# Patient Record
Sex: Female | Born: 1965 | Race: White | Hispanic: No | Marital: Married | Smoking: Never smoker
Health system: Southern US, Community
[De-identification: ages and names within clinical notes are randomized; demographics above are authoritative.]

## PROBLEM LIST (undated history)

## (undated) DIAGNOSIS — F419 Anxiety disorder, unspecified: Secondary | ICD-10-CM

## (undated) DIAGNOSIS — I82439 Acute embolism and thrombosis of unspecified popliteal vein: Secondary | ICD-10-CM

## (undated) DIAGNOSIS — I10 Essential (primary) hypertension: Secondary | ICD-10-CM

## (undated) DIAGNOSIS — T7840XA Allergy, unspecified, initial encounter: Secondary | ICD-10-CM

## (undated) HISTORY — DX: Acute embolism and thrombosis of unspecified popliteal vein: I82.439

## (undated) HISTORY — DX: Essential (primary) hypertension: I10

## (undated) HISTORY — DX: Allergy, unspecified, initial encounter: T78.40XA

## (undated) HISTORY — DX: Anxiety disorder, unspecified: F41.9

---

## 2004-05-02 ENCOUNTER — Ambulatory Visit: Payer: Self-pay | Admitting: Unknown Physician Specialty

## 2004-05-17 ENCOUNTER — Inpatient Hospital Stay: Payer: Self-pay

## 2004-07-17 ENCOUNTER — Inpatient Hospital Stay: Payer: Self-pay

## 2005-10-18 ENCOUNTER — Ambulatory Visit: Payer: Self-pay | Admitting: Unknown Physician Specialty

## 2007-05-15 ENCOUNTER — Emergency Department: Payer: Self-pay | Admitting: Emergency Medicine

## 2007-06-09 ENCOUNTER — Ambulatory Visit: Payer: Self-pay | Admitting: Obstetrics & Gynecology

## 2007-06-12 ENCOUNTER — Ambulatory Visit: Payer: Self-pay | Admitting: Obstetrics & Gynecology

## 2008-12-22 IMAGING — CT CT ABD-PELV W/ CM
1 of 2 series · 16 of 32 positions shown, 20 images · non-contrast
Comparison: none

REASON FOR EXAM: Left lower quadrant pain, fever, elevated WBC, history of
right ovarian
COMMENTS:

[Series 2: abd/pelvis · axial · 0.76mm/px · z∈[+385,+799]mm · 16 of 152 slices shown, 20 images]
[im 7/152  soft-tissue]
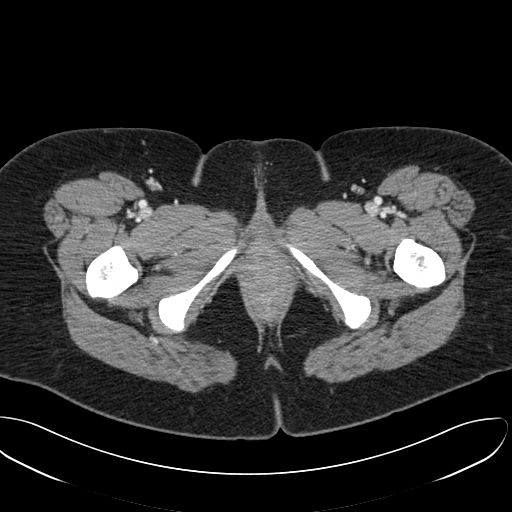
[im 7/152  bone]
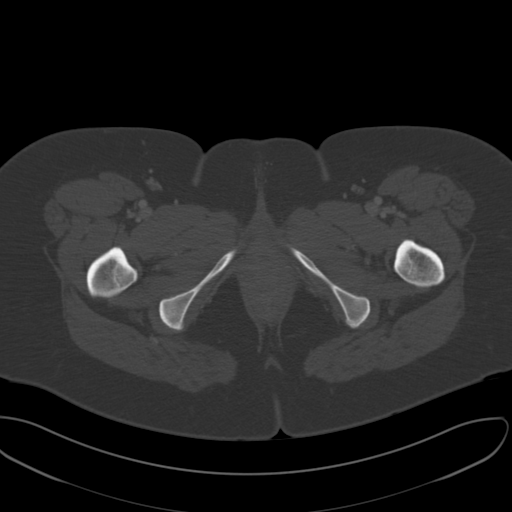
[im 19/152  soft-tissue]
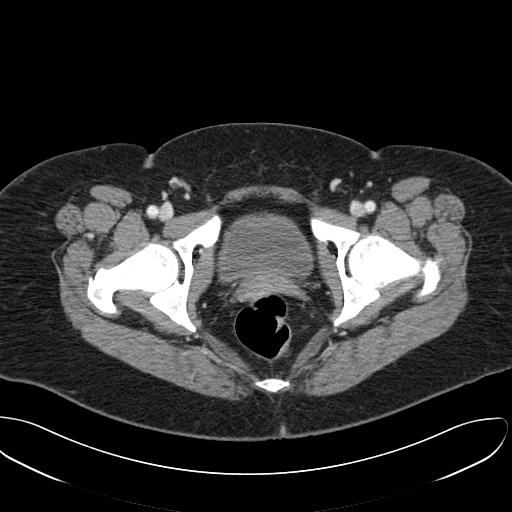
[im 32/152  soft-tissue]
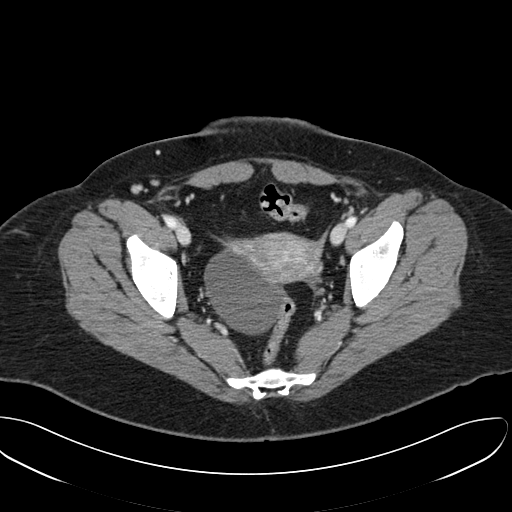
[im 38/152  soft-tissue]
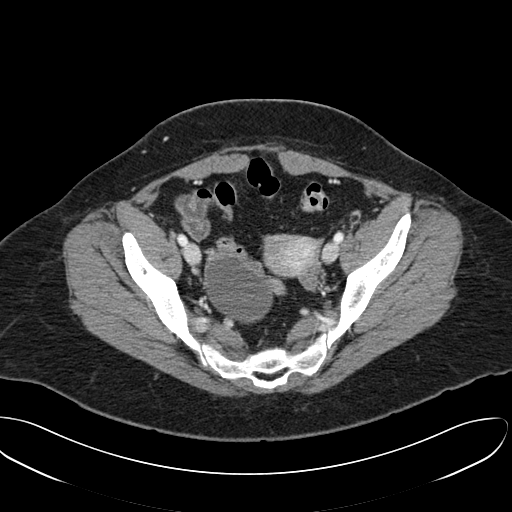
[im 51/152  soft-tissue]
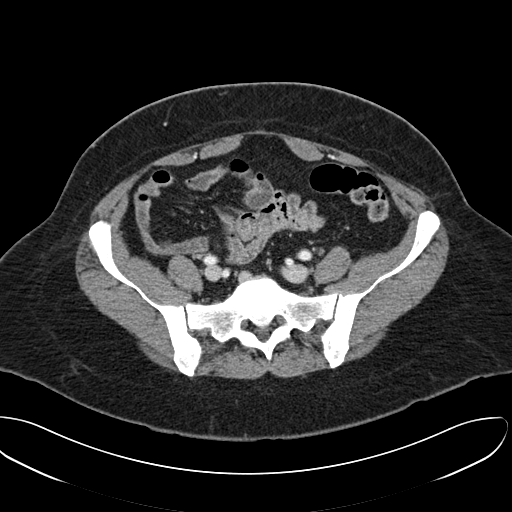
[im 63/152  soft-tissue]
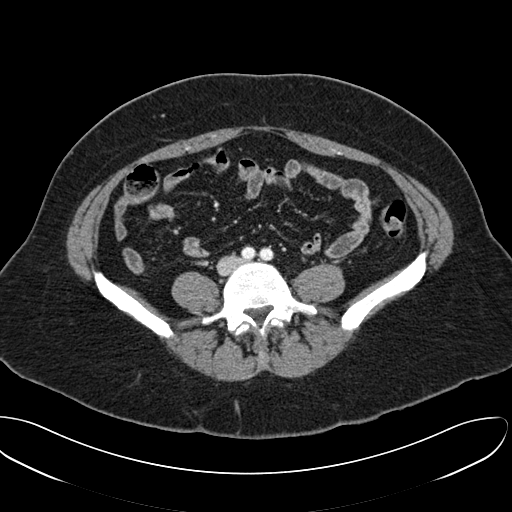
[im 70/152  soft-tissue]
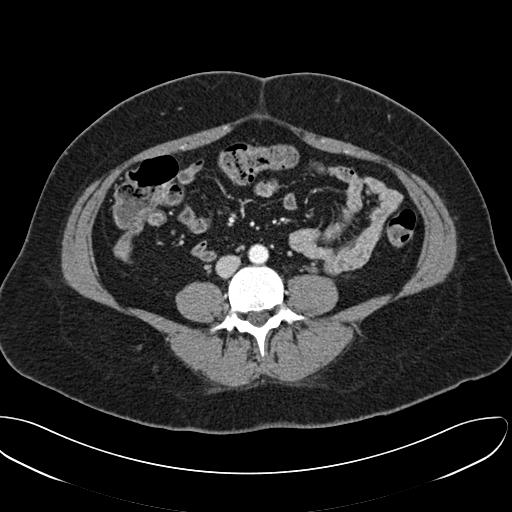
[im 82/152  soft-tissue]
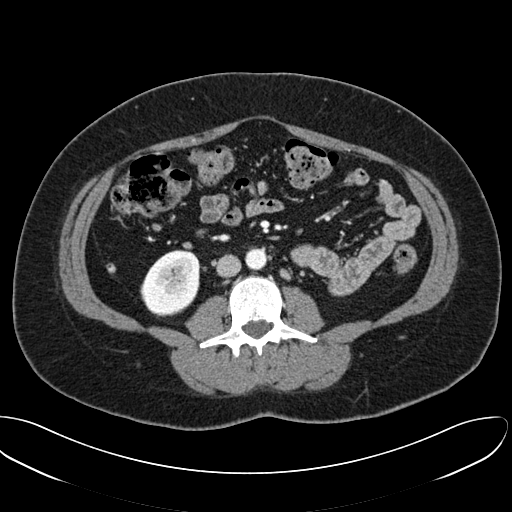
[im 89/152  soft-tissue]
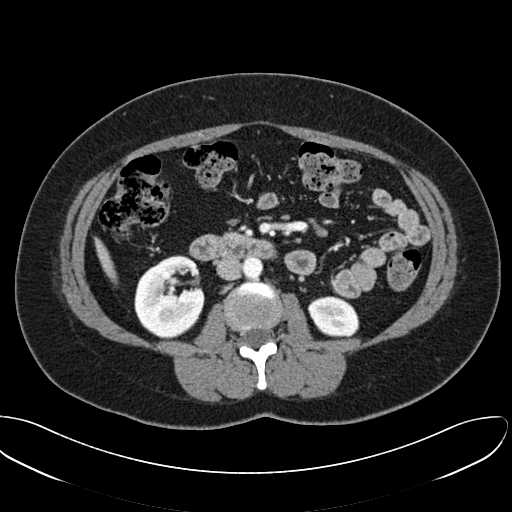
[im 89/152  bone]
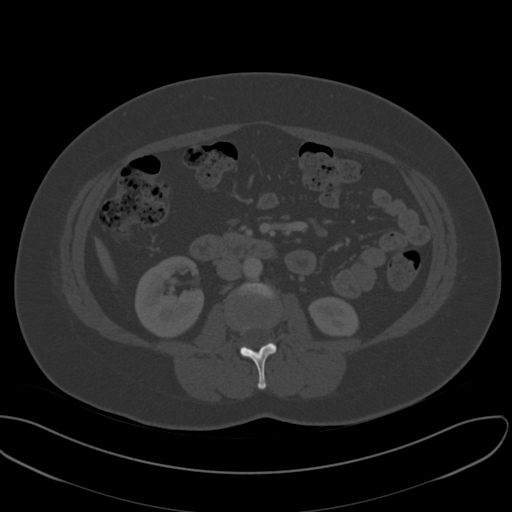
[im 101/152  soft-tissue]
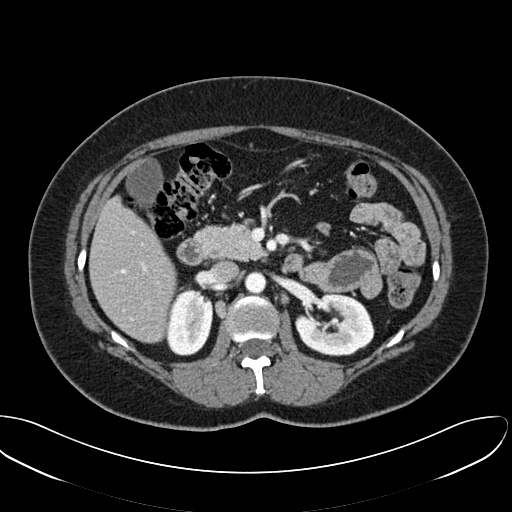
[im 114/152  soft-tissue]
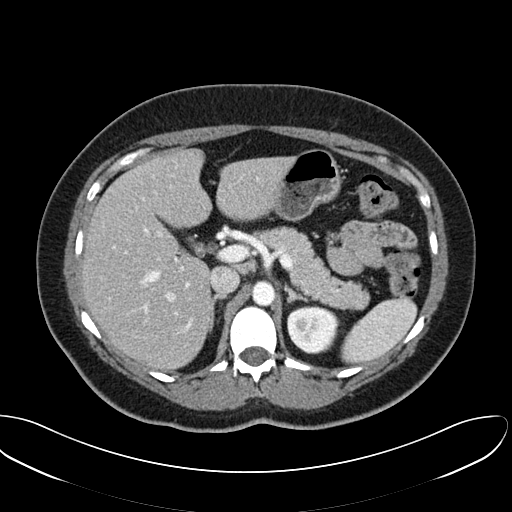
[im 120/152  soft-tissue]
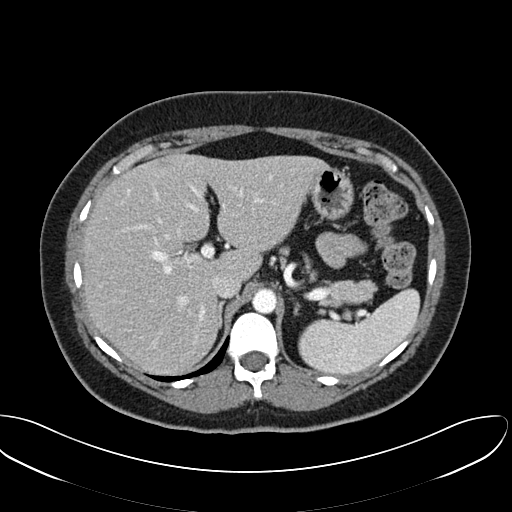
[im 126/152  lung]
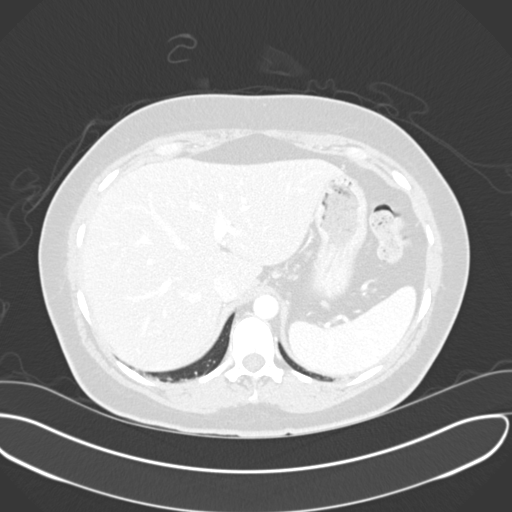
[im 133/152  soft-tissue]
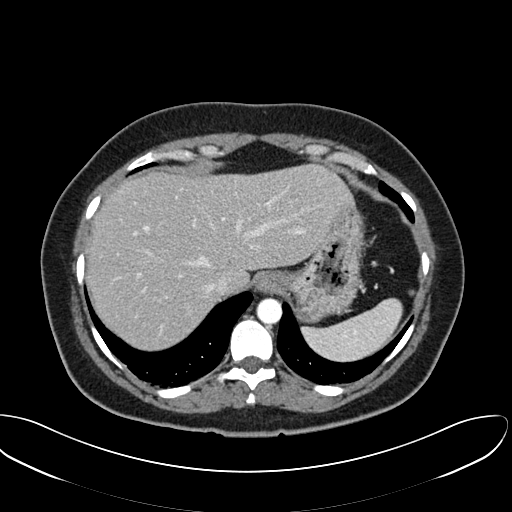
[im 133/152  lung]
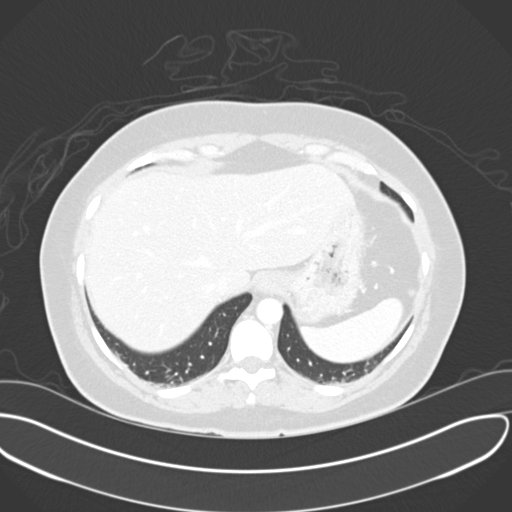
[im 139/152  lung]
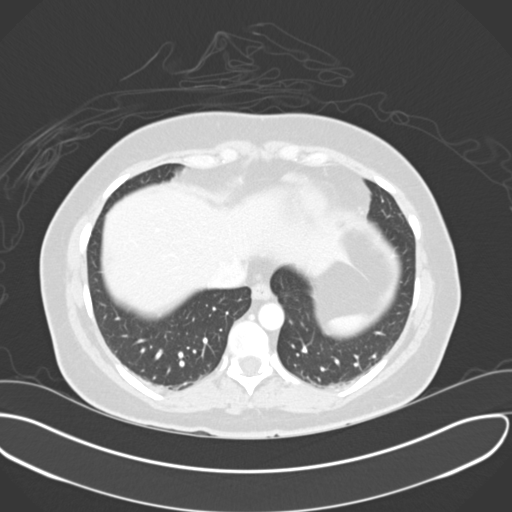
[im 145/152  soft-tissue]
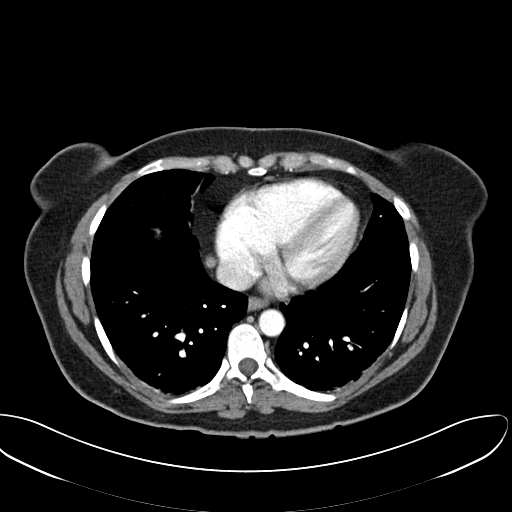
[im 145/152  lung]
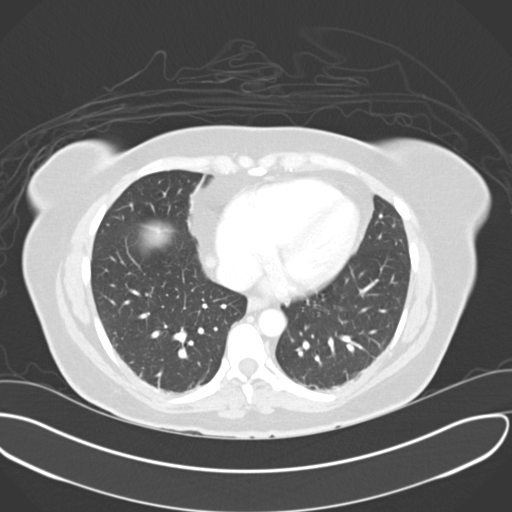

[16 of 32 positions shown; findings below may reference images not displayed]

PROCEDURE:     CT  - CT ABDOMEN / PELVIS  W  - May 15, 2007 [DATE]

RESULT:     IV contrast enhanced CT of abdomen and pelvis is obtained.

The liver and spleen are normal. The pancreas is normal. The adrenals are
normal. No focal renal abnormalities are identified. The lung bases are
clear. No free air is noted. The kidneys are unremarkable. The appendix is
unremarkable. The abdominal aorta is unremarkable. Shotty, retroperitoneal
lymph nodes are noted. These are nonspecific. An approximately 6.7 cm cyst
is noted in the RIGHT adnexal region. Ultrasound of the pelvis is suggested
for further evaluation. Small inguinal lymph nodes are noted. These appear
to be fatty replaced.
IMPRESSION: Large, RIGHT adnexal cyst for which ultrasound is suggested
for further evaluation. Pregnancy test may also prove useful.

## 2009-04-12 ENCOUNTER — Ambulatory Visit: Payer: Self-pay | Admitting: Family Medicine

## 2012-02-11 ENCOUNTER — Other Ambulatory Visit: Payer: Self-pay | Admitting: Obstetrics & Gynecology

## 2012-02-11 DIAGNOSIS — Z1239 Encounter for other screening for malignant neoplasm of breast: Secondary | ICD-10-CM

## 2012-02-11 DIAGNOSIS — Z803 Family history of malignant neoplasm of breast: Secondary | ICD-10-CM

## 2012-02-24 ENCOUNTER — Other Ambulatory Visit: Payer: Self-pay

## 2012-12-30 ENCOUNTER — Ambulatory Visit: Payer: Self-pay | Admitting: Obstetrics & Gynecology

## 2013-08-03 ENCOUNTER — Ambulatory Visit (INDEPENDENT_AMBULATORY_CARE_PROVIDER_SITE_OTHER): Payer: 59 | Admitting: Family Medicine

## 2013-08-03 ENCOUNTER — Encounter: Payer: Self-pay | Admitting: Family Medicine

## 2013-08-03 VITALS — BP 142/98 | HR 72 | Temp 98.2°F | Resp 16

## 2013-08-03 DIAGNOSIS — N951 Menopausal and female climacteric states: Secondary | ICD-10-CM

## 2013-08-03 DIAGNOSIS — F39 Unspecified mood [affective] disorder: Secondary | ICD-10-CM

## 2013-08-03 DIAGNOSIS — I10 Essential (primary) hypertension: Secondary | ICD-10-CM

## 2013-08-03 DIAGNOSIS — J309 Allergic rhinitis, unspecified: Secondary | ICD-10-CM

## 2013-08-03 DIAGNOSIS — R4586 Emotional lability: Secondary | ICD-10-CM

## 2013-08-03 MED ORDER — SERTRALINE HCL 50 MG PO TABS: 50.0000 mg | ORAL_TABLET | Freq: Every day | ORAL | Status: DC

## 2013-08-03 MED ORDER — MONTELUKAST SODIUM 10 MG PO TABS: 10.0000 mg | ORAL_TABLET | Freq: Every day | ORAL | Status: DC

## 2013-08-03 MED ORDER — AMOXICILLIN-POT CLAVULANATE 875-125 MG PO TABS: 1.0000 | ORAL_TABLET | Freq: Two times a day (BID) | ORAL | Status: DC

## 2013-08-03 NOTE — Progress Notes (Signed)
Subjective:    Patient ID: Amber Nelson, female    DOB: 12/24/1965, 48 y.o.   MRN: [id number]  HPI Chief Complaint  Patient presents with  . Hypertension    on metoprolol  . Dysmenorrhea    cramping/ wants hormones checked / also emotional   This chart was scribed for Ethelda ChickKristi M Smith, MD by Andrew Auaven Small, ED Scribe. This patient was seen in room 22 and the patient's care was started at 12:50 PM.  HPI Comments: Amber Nelson is a 48 y.o. female who presents to the [facility] to establish care and complaining of possible hormonal issues. Pt reports that she has been under a lot of stress due work. Pt denies home being stressful. Pt is miserable at work and is planning to apply to other places.  Pt has trouble controlling emotions. Pt becomes irritable easily. Pt takes xanax two days a week but as of lately has been trying to deal with her stress. Pt does not need refill for xanax. Pt states she is sleeping okay. She reports she wakes up during the night but has no trouble falling back to sleep. Pt has talked to Dr. Tiburcio PeaHarris about a hysterectomy. Pt had an ablation and reports irregular period consisting of spotting. She reports heavy cramping and has to take 3 ibuprofen 3 times a day to be able to sleep. Pt reports her BP is good. Pt reports her BP has been running 122/76. Pt has been exercising. Pt takes flonase, zyrtec and uses eye drops for her allergies. She reports nasal discharge clear but is coughing up yellow sputum.  History reviewed. No pertinent past medical history. Allergies  Allergen Reactions  . Sulfa Antibiotics    Prior to Admission medications   Not on File   Review of Systems  Constitutional: Negative for fever, chills, diaphoresis and fatigue.  HENT: Positive for rhinorrhea.   Eyes: Negative for visual disturbance.  Respiratory: Positive for cough. Negative for shortness of breath.   Cardiovascular: Negative for chest pain, palpitations and leg  swelling.  Gastrointestinal: Negative for nausea, vomiting, abdominal pain, diarrhea and constipation.  Endocrine: Negative for cold intolerance, heat intolerance, polydipsia, polyphagia and polyuria.  Genitourinary: Positive for menstrual problem.  Allergic/Immunologic: Positive for environmental allergies.  Neurological: Negative for dizziness, tremors, seizures, syncope, facial asymmetry, speech difficulty, weakness, light-headedness, numbness and headaches.  Psychiatric/Behavioral: Positive for dysphoric mood. Negative for suicidal ideas, sleep disturbance and self-injury. The patient is nervous/anxious.       Objective:   Physical Exam  Constitutional: She is oriented to person, place, and time. She appears well-developed and well-nourished. No distress.  HENT:  Head: Normocephalic and atraumatic.  Right Ear: Hearing, tympanic membrane, external ear and ear canal normal.  Left Ear: Hearing, tympanic membrane, external ear and ear canal normal.  Nose: Nose normal.  Mouth/Throat: Oropharynx is clear and moist.  Eyes: Conjunctivae and EOM are normal. Pupils are equal, round, and reactive to light.  Neck: Normal range of motion. Neck supple. Carotid bruit is not present. No thyromegaly present.  Cardiovascular: Normal rate, regular rhythm, normal heart sounds and intact distal pulses.  Exam reveals no gallop and no friction rub.   No murmur heard. Pulmonary/Chest: Effort normal and breath sounds normal. No respiratory distress. She has no wheezes. She has no rales. She exhibits no tenderness.  Abdominal: Soft. Bowel sounds are normal. She exhibits no distension and no mass. There is no tenderness. There is no rebound and no  guarding.  Musculoskeletal: Normal range of motion. She exhibits no edema.  Lymphadenopathy:    She has no cervical adenopathy.  Neurological: She is alert and oriented to person, place, and time. No cranial nerve deficit.  Skin: Skin is warm and dry. No rash noted.  She is not diaphoretic. No erythema. No pallor.  Psychiatric: Her behavior is normal. Her mood appears anxious.  Tearful  Nursing note and vitals reviewed.     Assessment & Plan:   1. Mood swings   2. Perimenopause   3. Allergic rhinitis, cause unspecified   4. Essential hypertension, benign     1. Mood swings/stress: New. Secondary to work stressors and less likely due to perimenopausal state.  Rx for Zoloft 50mg  daily provided; continue Xanax PRN. Limit caffeine intake; recommend daily regular exercise for stress management; recommend psychotherapy.  Good family support. Obtain labs to rule out secondary causes of symptoms. 2.  Perimenopausal state: New.  Followed closely by gynecology.  Obtain labs.   3.  Allergic Rhinitis: uncontrolled; rx for Singulair provided. If no improvement in allergy symptoms in one week, rx for Augmentin provided. 4. HTN: controlled; obtain labs; continue Metoprolol.   Meds ordered this encounter  Medications  . metoprolol succinate (TOPROL-XL) 25 MG 24 hr tablet    Sig: Take 25 mg by mouth daily.  Marland Kitchen. ALPRAZolam (XANAX) 0.5 MG tablet    Sig: Take 0.25-0.5 mg by mouth at bedtime as needed for anxiety.  Marland Kitchen. azelastine (OPTIVAR) 0.05 % ophthalmic solution    Sig: Place 1 drop into both eyes daily.  . fluticasone (FLONASE) 50 MCG/ACT nasal spray    Sig: Place 2 sprays into both nostrils daily.  Marland Kitchen. DISCONTD: montelukast (SINGULAIR) 10 MG tablet    Sig: Take 10 mg by mouth at bedtime.  Marland Kitchen. DISCONTD: sertraline (ZOLOFT) 50 MG tablet    Sig: Take 1 tablet (50 mg total) by mouth daily.    Dispense:  30 tablet    Refill:  5  . sertraline (ZOLOFT) 50 MG tablet    Sig: Take 1 tablet (50 mg total) by mouth daily.    Dispense:  90 tablet    Refill:  3  . montelukast (SINGULAIR) 10 MG tablet    Sig: Take 1 tablet (10 mg total) by mouth at bedtime.    Dispense:  90 tablet    Refill:  3  . amoxicillin-clavulanate (AUGMENTIN) 875-125 MG per tablet    Sig: Take 1  tablet by mouth 2 (two) times daily.    Dispense:  20 tablet    Refill:  0     I personally performed the services described in this documentation, which was scribed in my presence.  The recorded information has been reviewed and is accurate.  Nilda SimmerKristi Smith, M.D.  [facility]  [place] [address] [place], [place]  [id number] [phone] phone [phone] fax

## 2013-09-17 ENCOUNTER — Encounter: Payer: Self-pay | Admitting: Family Medicine

## 2014-05-06 ENCOUNTER — Ambulatory Visit: Payer: Self-pay | Admitting: Obstetrics & Gynecology

## 2014-05-06 DIAGNOSIS — I10 Essential (primary) hypertension: Secondary | ICD-10-CM

## 2014-05-13 ENCOUNTER — Ambulatory Visit: Payer: Self-pay | Admitting: Obstetrics & Gynecology

## 2014-05-13 HISTORY — PX: ABDOMINAL HYSTERECTOMY: SHX81

## 2014-07-12 ENCOUNTER — Encounter: Payer: Self-pay | Admitting: Family Medicine

## 2014-07-12 ENCOUNTER — Ambulatory Visit (INDEPENDENT_AMBULATORY_CARE_PROVIDER_SITE_OTHER): Payer: 59 | Admitting: Family Medicine

## 2014-07-12 VITALS — BP 164/96 | HR 69 | Temp 98.4°F | Resp 16 | Ht 63.25 in | Wt 189.4 lb

## 2014-07-12 DIAGNOSIS — H1013 Acute atopic conjunctivitis, bilateral: Secondary | ICD-10-CM

## 2014-07-12 DIAGNOSIS — E669 Obesity, unspecified: Secondary | ICD-10-CM

## 2014-07-12 DIAGNOSIS — F4323 Adjustment disorder with mixed anxiety and depressed mood: Secondary | ICD-10-CM | POA: Diagnosis not present

## 2014-07-12 DIAGNOSIS — I10 Essential (primary) hypertension: Secondary | ICD-10-CM | POA: Diagnosis not present

## 2014-07-12 DIAGNOSIS — J301 Allergic rhinitis due to pollen: Secondary | ICD-10-CM

## 2014-07-12 LAB — POCT URINALYSIS DIPSTICK
Bilirubin, UA: NEGATIVE
Blood, UA: NEGATIVE
GLUCOSE UA: NEGATIVE
Ketones, UA: NEGATIVE
Leukocytes, UA: NEGATIVE
Nitrite, UA: NEGATIVE
PROTEIN UA: NEGATIVE
SPEC GRAV UA: 1.01
Urobilinogen, UA: 0.2
pH, UA: 6

## 2014-07-12 LAB — SURGICAL PATHOLOGY

## 2014-07-12 MED ORDER — METOPROLOL SUCCINATE ER 50 MG PO TB24: 50.0000 mg | ORAL_TABLET | Freq: Every day | ORAL | Status: DC

## 2014-07-12 MED ORDER — AZELASTINE HCL 0.05 % OP SOLN: 1.0000 [drp] | Freq: Two times a day (BID) | OPHTHALMIC | Status: DC

## 2014-07-12 NOTE — Progress Notes (Signed)
Subjective:    Patient ID: Amber Nelson, female    DOB: 07/28/1965, 49 y.o.   MRN: [id number]  07/12/2014  MEDICATION REVIEW; Hypertension; and Anxiety   HPI This 49 y.o. female presents for eleven month follow-up:  1.  Dysmenorrhea: s/p hysterectomy; [facility].  Two weeks out of work. Doing well.    2.  Anxiety and depression: management changes made at last visit included adding Zoloft 50mg  daily.  Doing much better emotionally. new job 01/2014; decreased stress; driving really stressed out.  Does not like traffic.  Different atmosphere.  Now back in [place].  Noticed an improvement; anxiety has been better.  Knew Zoloft would work.   Son plays travel baseball.  No Xanax since 01/2014 with job change. Ready to wean off of Zoloft.      3.  HTN: taking Metorpolol XL 25mg  daily.  Taking at night.  Exercising; going to boot camp.  Last year killed self to make BMI for work.  Cleared to exercise now.  Patient reports good compliance with medication, good tolerance to medication, and good symptom control.  Denies CP/palp/SOB/leg swelling; denies HA/dizziness/focal weakness/paresthesias.  Does not check BP at home or at work.   4.  Allergic Rhinitis:  Patient reports good compliance with medication, good tolerance to medication, and good symptom control.    Taking Singulair, Optivar, Flonase.  No allergist.  Does not like allergy shots.    Last physical:  2015 Pap smear: 2015 Mammogram:  Due now Colonoscopy: never TDAP:  refuses Influenza: 12-2013 [facility] Eye exam:  yearly Dental exam:  Every six months.    Review of Systems  Constitutional: Negative for fever, chills, diaphoresis and fatigue.  Eyes: Positive for redness and itching. Negative for photophobia, discharge and visual disturbance.  Respiratory: Negative for cough and shortness of breath.   Cardiovascular: Negative for chest pain, palpitations and leg swelling.  Gastrointestinal: Negative for nausea, vomiting,  abdominal pain, diarrhea and constipation.  Endocrine: Negative for cold intolerance, heat intolerance, polydipsia, polyphagia and polyuria.  Neurological: Negative for dizziness, tremors, seizures, syncope, facial asymmetry, speech difficulty, weakness, light-headedness, numbness and headaches.  Psychiatric/Behavioral: Negative for suicidal ideas, sleep disturbance, self-injury, dysphoric mood and decreased concentration. The patient is not nervous/anxious.     Past Medical History  Diagnosis Date  . Allergy   . Anxiety   . Hypertension    Past Surgical History  Procedure Laterality Date  . Abdominal hysterectomy  05/13/14    ovaries intact; DUB, dysmenorrhea.  [facility].  . Cesarean section     Allergies  Allergen Reactions  . Sulfa Antibiotics    History   Social History  . Marital Status: Married    Spouse Name: N/A  . Number of Children: N/A  . Years of Education: N/A   Occupational History  . Not on file.   Social History Main Topics  . Smoking status: Never Smoker   . Smokeless tobacco: Never Used  . Alcohol Use: 0.6 oz/week    1 Cans of beer per week  . Drug Use: No  . Sexual Activity: Yes    Birth Control/ Protection: Surgical   Other Topics Concern  . Not on file   Social History Narrative   Marital status: married x 26 years      Children:  1 son (10)      Lives: with husband, son, dog.      Employment:  [employer] x 20 years      Tobacco; none  Alcohol: none      Drugs: none      Exercise: some   Family History  Problem Relation Age of Onset  . Multiple sclerosis Mother   . Diabetes Mother   . Heart disease Father 56    AMI x 2; stenting; abdominal aneurysm  . Hyperlipidemia Father   . Hypertension Father   . Deep vein thrombosis Sister   . Factor V Leiden deficiency Sister          Objective:    BP 164/96 mmHg  Pulse 69  Temp(Src) 98.4 F (36.9 C) (Oral)  Resp 16  Ht 5' 3.25" (1.607 m)  Wt 189 lb 6.4 oz (85.911 kg)  BMI 33.27  kg/m2  SpO2 96%  LMP 05/06/2014 Physical Exam  Constitutional: She is oriented to person, place, and time. She appears well-developed and well-nourished. No distress.  HENT:  Head: Normocephalic and atraumatic.  Right Ear: External ear normal.  Left Ear: External ear normal.  Nose: Nose normal.  Mouth/Throat: Oropharynx is clear and moist.  Eyes: Conjunctivae and EOM are normal. Pupils are equal, round, and reactive to light.  Neck: Normal range of motion. Neck supple. Carotid bruit is not present. No thyromegaly present.  Cardiovascular: Normal rate, regular rhythm, normal heart sounds and intact distal pulses.  Exam reveals no gallop and no friction rub.   No murmur heard. Pulmonary/Chest: Effort normal and breath sounds normal. She has no wheezes. She has no rales.  Abdominal: Soft. Bowel sounds are normal. She exhibits no distension and no mass. There is no tenderness. There is no rebound and no guarding.  Lymphadenopathy:    She has no cervical adenopathy.  Neurological: She is alert and oriented to person, place, and time. No cranial nerve deficit.  Skin: Skin is warm and dry. No rash noted. She is not diaphoretic. No erythema. No pallor.  Psychiatric: She has a normal mood and affect. Her behavior is normal.   Results for orders placed or performed in visit on 07/12/14  POCT urinalysis dipstick  Result Value Ref Range   Color, UA Light Yellow    Clarity, UA Clear    Glucose, UA Negative    Bilirubin, UA Negative    Ketones, UA Negative    Spec Grav, UA 1.010    Blood, UA Negative    pH, UA 6.0    Protein, UA Negative    Urobilinogen, UA 0.2    Nitrite, UA Negative    Leukocytes, UA Negative        Assessment & Plan:   1. Essential hypertension, benign   2. Allergic rhinitis due to pollen   3. Allergic conjunctivitis, bilateral   4. Adjustment disorder with mixed anxiety and depressed mood   5. Obesity (BMI 30.0-34.9)     1. HTN:  Uncontrolled; increase  Metoprolol ER to  daily; will likely warrant second agent.  Obtain EKG at next visit.  RTC 3 months. Obtain labs. Exercise, weight loss, low sodium food choices. 2.  Allergic Rhinitis: controlled; continue Flonase and Singulair. 3.  Allergic conjunctivitis: stable; refill provided. 4.  Adjustment disorder with anxiety: improved; recommend weaning Zoloft over next month.  No recent Xanax use. 5. Obesity: recommend weight loss, exercise, low-fat and low-calorie food choices.  Restart [email or web address].  Meds ordered this encounter  Medications  . OVER THE COUNTER MEDICATION    Sig: daily.  . metoprolol succinate (TOPROL-XL) 50 MG 24 hr tablet    Sig: Take 1 tablet (50 mg total)  by mouth daily. Take with or immediately following a meal.    Dispense:  90 tablet    Refill:  3  . azelastine (OPTIVAR) 0.05 % ophthalmic solution    Sig: Place 1 drop into both eyes 2 (two) times daily.    Dispense:  6 mL    Refill:  12    Return in about 2 months (around 09/11/2014) for recheck.    Madden Garron Paulita Fujita, M.D. [facility] [address] [place], [place]  [address] [phone] phone [phone] fax

## 2014-07-12 NOTE — Patient Instructions (Signed)

## 2014-07-13 ENCOUNTER — Encounter: Payer: Self-pay | Admitting: Family Medicine

## 2014-07-13 DIAGNOSIS — E669 Obesity, unspecified: Secondary | ICD-10-CM | POA: Insufficient documentation

## 2014-07-13 DIAGNOSIS — I10 Essential (primary) hypertension: Secondary | ICD-10-CM | POA: Insufficient documentation

## 2014-07-13 DIAGNOSIS — F4323 Adjustment disorder with mixed anxiety and depressed mood: Secondary | ICD-10-CM | POA: Insufficient documentation

## 2014-07-13 DIAGNOSIS — H1010 Acute atopic conjunctivitis, unspecified eye: Secondary | ICD-10-CM | POA: Insufficient documentation

## 2014-07-13 DIAGNOSIS — J301 Allergic rhinitis due to pollen: Secondary | ICD-10-CM | POA: Insufficient documentation

## 2014-07-13 DIAGNOSIS — H1013 Acute atopic conjunctivitis, bilateral: Secondary | ICD-10-CM | POA: Insufficient documentation

## 2014-07-18 NOTE — Op Note (Signed)
PATIENT NAME:  Amber Nelson, Amber Nelson MR#:  [id number] DATE OF BIRTH:  [birth year]  DATE OF PROCEDURE:  05/13/2014  PREOPERATIVE DIAGNOSIS: Severe dysmenorrhea and chronic pelvic pain.   POSTOPERATIVE DIAGNOSIS: Severe dysmenorrhea and chronic pelvic pain.   PROCEDURE: Total laparoscopic hysterectomy with bilateral salpingectomy and cystoscopy.   SURGEON: Dierdre Searles. Paul Harris, MD   ASSISTANT:   Florina OuAndreas M. Bonney AidStaebler, MD  ANESTHESIA: General.   ESTIMATED BLOOD LOSS: 50 mL.   COMPLICATIONS: None.   FINDINGS: Normal ovaries.   DISPOSITION: To recovery room stable.   TECHNIQUE: The patient was prepped and draped in the usual sterile fashion after adequate anesthesia was obtained in the dorsal lithotomy position. Foley catheter was inserted. A speculum was placed and a V-Care device was placed in the uterus after it was sounded to 7 cm.   Attention was then turned to the abdomen where a Veress needle was inserted through a 5 mm infraumbilical incision after Marcaine was used to anesthetize the skin. Veress needle placement was confirmed using the hanging drop technique and the abdomen was then insufflated with CO2 gas. A 5 mm trocar was then inserted under direct visualization  with the laparoscope with no injuries or bleeding noted.   The patient was placed in Trendelenburg positioning and the above-mentioned findings were visualized. There was a small perforation of the V-Care device through the fundus of the uterus with no other injuries or bleeding noted. An 11 mm trocar was placed in the right lower quadrant and a 5 mm trocar was placed in the left lower quadrant lateral to the inferior epigastric blood vessels with no injuries or bleeding noted. The round ligaments were identified, and carefully coagulated and cut using the 5 mm Harmonic scalpel. The anterior and posterior leaves of the broad ligament were then dissected. The fallopian tubes were dissected away from the mesosalpinx with preservation of  the ovaries and their main blood supply. The uterine ovarian blood vessels and their ligaments were then carefully cut and the dissection was carried down to the level of the uterine arteries, which are then coagulated using bipolar cautery device. Ureters were observed to be out of harm's way. The bladder was inferiorly dissected off of the lower uterine segment and cervix. The V-Care device was noted at the cervicovaginal junction tenting through the structures, and then a circumferential incision was made with the Harmonic scalpel to completely amputate the cervix along with the uterus and adnexa. This was then removed vaginally and a sponge was placed in the vagina to maintain pneumoperitoneum.   The vaginal cuff was closed with interrupted Polysorb sutures using the EndoStitch device. Vaginal exam confirmed complete closure. The pelvic cavity was irrigated with aspiration of all fluid. Gas was expelled. Trocars were removed and skin was closed with Dermabond.   Cystoscopy was performed with a 0-degree cystoscope and saline distention of the bladder. There were no injuries to the bladder and there was bilateral ureteral flow from each ureteral orifice. Cystoscope was removed and Foley catheters were replaced to go to recovery room with the patient. The patient tolerated the procedure well. All sponge, instrument, and needle counts were correct.  ____________________________ Nelson. Annamarie MajorPaul Harris, MD rph:am D: 05/13/2014 11:42:45 ET T: 05/13/2014 23:38:34 ET JOB#: [id number]  cc: Dierdre Searles. Paul Harris, MD, <Dictator> Nadara MustardOBERT P HARRIS MD ELECTRONICALLY SIGNED 05/14/2014 8:07

## 2014-07-22 ENCOUNTER — Other Ambulatory Visit: Payer: Self-pay | Admitting: Family Medicine

## 2014-10-11 ENCOUNTER — Ambulatory Visit: Payer: 59 | Admitting: Family Medicine

## 2014-10-18 ENCOUNTER — Ambulatory Visit (INDEPENDENT_AMBULATORY_CARE_PROVIDER_SITE_OTHER): Payer: 59 | Admitting: Family Medicine

## 2014-10-18 ENCOUNTER — Encounter: Payer: Self-pay | Admitting: Family Medicine

## 2014-10-18 VITALS — BP 130/96 | HR 65 | Temp 99.2°F | Resp 16 | Ht 63.5 in | Wt 192.8 lb

## 2014-10-18 DIAGNOSIS — M7711 Lateral epicondylitis, right elbow: Secondary | ICD-10-CM | POA: Diagnosis not present

## 2014-10-18 DIAGNOSIS — J301 Allergic rhinitis due to pollen: Secondary | ICD-10-CM | POA: Diagnosis not present

## 2014-10-18 DIAGNOSIS — E669 Obesity, unspecified: Secondary | ICD-10-CM | POA: Diagnosis not present

## 2014-10-18 DIAGNOSIS — H1013 Acute atopic conjunctivitis, bilateral: Secondary | ICD-10-CM

## 2014-10-18 DIAGNOSIS — I10 Essential (primary) hypertension: Secondary | ICD-10-CM | POA: Diagnosis not present

## 2014-10-18 DIAGNOSIS — F4323 Adjustment disorder with mixed anxiety and depressed mood: Secondary | ICD-10-CM | POA: Diagnosis not present

## 2014-10-18 NOTE — Progress Notes (Signed)
Subjective:    Patient ID: Amber Nelson, female    DOB: [birth year], 49 y.o.   MRN: [id number]  10/18/2014  Follow-up; Hypertension; Depression; and Anxiety   HPI This 49 y.o. female presents for evaluation of the following:  1. Hypertension:  Increased Metoprolol ER  daily. BP 09/28/14 at work screening 118/78.  Not checking at home.  No side effects of higher dose; taking at night.  Denies CP/palp/SOB/leg swelling. Denies HA/dizziness/focal weakness/paresthesias.  Has not worked on weight loss or exercise until today.  2. Obesity: planned to work on weight loss and exercise after last visit.  Started program today for weight loss and exercise.  Working out four days pwer week; two in [place] and two days at gym.  3.  Anxiety and depression: planned to wean off of Zoloft at last visit.  Stressors decreased with change in jobs at [employer].  Doing well off of Zoloft. No concerns.  4. R elbow pain lateral: onset two weeks ago; trainer thinks repetitive motion triggered; onset two weeks ago. Chronic neck pain.  No n/t/w.  No worsening neck pain; elbow is tender to palpation; pt feels pain originating in elbow and not from neck.    5. Allergic Rhinitis: worsening in past two weeks; +sneezing.  Has not restarted allergy medications for allergy season.  Review of Systems  Constitutional: Negative for fever, chills, diaphoresis and fatigue.  HENT: Positive for congestion, rhinorrhea and sneezing. Negative for sore throat, trouble swallowing and voice change.   Eyes: Negative for visual disturbance.  Respiratory: Negative for cough and shortness of breath.   Cardiovascular: Negative for chest pain, palpitations and leg swelling.  Gastrointestinal: Negative for nausea, vomiting, abdominal pain, diarrhea and constipation.  Endocrine: Negative for cold intolerance, heat intolerance, polydipsia, polyphagia and polyuria.  Musculoskeletal: Positive for myalgias, arthralgias, neck pain and  neck stiffness.  Skin: Negative for rash.  Neurological: Negative for dizziness, tremors, seizures, syncope, facial asymmetry, speech difficulty, weakness, light-headedness, numbness and headaches.  Psychiatric/Behavioral: Negative for suicidal ideas, sleep disturbance, self-injury and dysphoric mood. The patient is not nervous/anxious.     Past Medical History  Diagnosis Date  . Allergy   . Anxiety   . Hypertension    Past Surgical History  Procedure Laterality Date  . Abdominal hysterectomy  05/13/14    ovaries intact; DUB, dysmenorrhea.  [facility].  . Cesarean section     Allergies  Allergen Reactions  . Sulfa Antibiotics    Current Outpatient Prescriptions  Medication Sig Dispense Refill  . ALPRAZolam (XANAX) 0.5 MG tablet Take 0.25-0.5 mg by mouth at bedtime as needed for anxiety.    Marland Kitchen azelastine (OPTIVAR) 0.05 % ophthalmic solution Place 1 drop into both eyes 2 (two) times daily. 6 mL 12  . fluticasone (FLONASE) 50 MCG/ACT nasal spray Place 2 sprays into both nostrils daily.    . metoprolol succinate (TOPROL-XL) 50 MG 24 hr tablet Take 1 tablet (50 mg total) by mouth daily. Take with or immediately following a meal. 90 tablet 3  . montelukast (SINGULAIR) 10 MG tablet Take 1 tablet by mouth at  bedtime 90 tablet 3  . OVER THE COUNTER MEDICATION daily.    . sertraline (ZOLOFT) 50 MG tablet Take 1 tablet by mouth  daily (Patient not taking: Reported on 10/18/2014) 90 tablet 0   No current facility-administered medications for this visit.   History   Social History  . Marital Status: Married    Spouse Name: N/A  . Number  of Children: N/A  . Years of Education: N/A   Occupational History  . Not on file.   Social History Main Topics  . Smoking status: Never Smoker   . Smokeless tobacco: Never Used  . Alcohol Use: 0.6 oz/week    1 Cans of beer per week  . Drug Use: No  . Sexual Activity: Yes    Birth Control/ Protection: Surgical   Other Topics Concern  . Not on file     Social History Narrative   Marital status: married x 26 years      Children:  1 son (10)      Lives: with husband, son, dog.      Employment:  [employer] x 20 years      Tobacco; none      Alcohol: none      Drugs: none      Exercise: some        Objective:    BP 130/96 mmHg  Pulse 65  Temp(Src) 99.2 F (37.3 C) (Oral)  Resp 16  Ht 5' 3.5" (1.613 m)  Wt 192 lb 12.8 oz (87.454 kg)  BMI 33.61 kg/m2  SpO2 96%  LMP 05/06/2014 Physical Exam  Constitutional: She is oriented to person, place, and time. She appears well-developed and well-nourished. No distress.  HENT:  Head: Normocephalic and atraumatic.  Right Ear: External ear normal.  Left Ear: External ear normal.  Nose: Nose normal.  Mouth/Throat: Oropharynx is clear and moist.  Eyes: Conjunctivae and EOM are normal. Pupils are equal, round, and reactive to light.  Neck: Normal range of motion. Neck supple. Carotid bruit is not present. No thyromegaly present.  Cardiovascular: Normal rate, regular rhythm, normal heart sounds and intact distal pulses.  Exam reveals no gallop and no friction rub.   No murmur heard. Pulmonary/Chest: Effort normal and breath sounds normal. She has no wheezes. She has no rales.  Abdominal: Soft. Bowel sounds are normal. She exhibits no distension and no mass. There is no tenderness. There is no rebound and no guarding.  Musculoskeletal:       Right elbow: She exhibits normal range of motion, no swelling and no effusion. Tenderness found. Lateral epicondyle tenderness noted. No radial head, no medial epicondyle and no olecranon process tenderness noted.       Right wrist: Normal. She exhibits normal range of motion, no tenderness, no bony tenderness and no swelling.       Right forearm: Normal. She exhibits no tenderness, no bony tenderness and no swelling.  Lymphadenopathy:    She has no cervical adenopathy.  Neurological: She is alert and oriented to person, place, and time. No cranial nerve  deficit.  Skin: Skin is warm and dry. No rash noted. She is not diaphoretic. No erythema. No pallor.  Psychiatric: She has a normal mood and affect. Her behavior is normal.   Results for orders placed or performed in visit on 07/12/14  POCT urinalysis dipstick  Result Value Ref Range   Color, UA Light Yellow    Clarity, UA Clear    Glucose, UA Negative    Bilirubin, UA Negative    Ketones, UA Negative    Spec Grav, UA 1.010    Blood, UA Negative    pH, UA 6.0    Protein, UA Negative    Urobilinogen, UA 0.2    Nitrite, UA Negative    Leukocytes, UA Negative        Assessment & Plan:   1. Essential hypertension, benign  2. Obesity (BMI 30.0-34.9)   3. Adjustment disorder with mixed anxiety and depressed mood   4. Allergic rhinitis due to pollen   5. Allergic conjunctivitis, bilateral   6. Tennis elbow, right    1. HTN: improved but borderline control; recent normal BP at work; pt reports White Coat Syndrome; no changes to medication at this time; work on weight loss, exercise, low-sodium food choices. 2.  Obesity: unchanged; starting weight loss program today with exercise; has a trainer. 3.  Anxiety and depression: stable off of medication.   4.  Allergic Rhinitis: worsening; advised to restart medications. 5.  R lateral epicondylitis: New.  Recommend rest, icing bid, wrist or forearm brace daily for the next two weeks.    No orders of the defined types were placed in this encounter.    Return in about 3 months (around 01/18/2015) for recheck weight check, blood pressure check.     Jarell Mcewen Paulita Fujita, M.D. [facility] [address] [place], [place]  [address] [phone] phone [phone] fax

## 2014-10-18 NOTE — Patient Instructions (Signed)

## 2014-12-15 LAB — HM MAMMOGRAPHY: HM MAMMO: NEGATIVE

## 2015-01-12 ENCOUNTER — Encounter: Payer: Self-pay | Admitting: Family Medicine

## 2015-01-24 ENCOUNTER — Ambulatory Visit (INDEPENDENT_AMBULATORY_CARE_PROVIDER_SITE_OTHER): Payer: 59 | Admitting: Family Medicine

## 2015-01-24 ENCOUNTER — Encounter: Payer: Self-pay | Admitting: Family Medicine

## 2015-01-24 VITALS — BP 160/84 | HR 73 | Temp 97.9°F | Resp 16 | Ht 63.5 in | Wt 192.8 lb

## 2015-01-24 DIAGNOSIS — J301 Allergic rhinitis due to pollen: Secondary | ICD-10-CM

## 2015-01-24 DIAGNOSIS — F4323 Adjustment disorder with mixed anxiety and depressed mood: Secondary | ICD-10-CM | POA: Diagnosis not present

## 2015-01-24 DIAGNOSIS — I10 Essential (primary) hypertension: Secondary | ICD-10-CM

## 2015-01-24 DIAGNOSIS — E669 Obesity, unspecified: Secondary | ICD-10-CM

## 2015-01-24 MED ORDER — FLUTICASONE PROPIONATE 50 MCG/ACT NA SUSP: 2.0000 | Freq: Every day | NASAL | Status: DC

## 2015-01-24 NOTE — Progress Notes (Signed)
Subjective:    Patient ID: Amber Nelson, female    DOB: 05/31/1965, 49 y.o.   MRN: [id number]  01/24/2015  Follow-up and Medication Refill   HPI This 49 y.o. female presents for three month follow-up:   1. HTN:  Patient reports good compliance with medication, good tolerance to medication, and good symptom control.   Not checking BP at home.   2.  Allergic Rhinitis: Patient reports good compliance with medication, good tolerance to medication, and good symptom control.  Needs RF Flonase.  Not taking Singulair.  3. Obesity: weight unchanged from recent visit.  Will get flu vaccine at work this week.  2015 [facility].   Review of Systems  Constitutional: Negative for fever, chills, diaphoresis and fatigue.  Eyes: Negative for visual disturbance.  Respiratory: Negative for cough and shortness of breath.   Cardiovascular: Negative for chest pain, palpitations and leg swelling.  Gastrointestinal: Negative for nausea, vomiting, abdominal pain, diarrhea and constipation.  Endocrine: Negative for cold intolerance, heat intolerance, polydipsia, polyphagia and polyuria.  Neurological: Negative for dizziness, tremors, seizures, syncope, facial asymmetry, speech difficulty, weakness, light-headedness, numbness and headaches.    Past Medical History  Diagnosis Date  . Allergy   . Anxiety   . Hypertension    Past Surgical History  Procedure Laterality Date  . Abdominal hysterectomy  05/13/14    ovaries intact; DUB, dysmenorrhea.  [facility].  . Cesarean section     Allergies  Allergen Reactions  . Sulfa Antibiotics    Current Outpatient Prescriptions  Medication Sig Dispense Refill  . azelastine (OPTIVAR) 0.05 % ophthalmic solution Place 1 drop into both eyes 2 (two) times daily. 6 mL 12  . metoprolol succinate (TOPROL-XL) 50 MG 24 hr tablet Take 1 tablet (50 mg total) by mouth daily. Take with or immediately following a meal. 90 tablet 3  . OVER THE COUNTER  MEDICATION daily.    Marland Kitchen. ALPRAZolam (XANAX) 0.5 MG tablet Take 0.25-0.5 mg by mouth at bedtime as needed for anxiety.    . fluticasone (FLONASE) 50 MCG/ACT nasal spray Place 2 sprays into both nostrils daily. 48 g 3  . montelukast (SINGULAIR) 10 MG tablet Take 1 tablet by mouth at  bedtime (Patient not taking: Reported on 01/24/2015) 90 tablet 3   No current facility-administered medications for this visit.   Social History   Social History  . Marital Status: Married    Spouse Name: N/A  . Number of Children: N/A  . Years of Education: N/A   Occupational History  . Not on file.   Social History Main Topics  . Smoking status: Never Smoker   . Smokeless tobacco: Never Used  . Alcohol Use: 0.6 oz/week    1 Cans of beer per week  . Drug Use: No  . Sexual Activity: Yes    Birth Control/ Protection: Surgical   Other Topics Concern  . Not on file   Social History Narrative   Marital status: married x 26 years      Children:  1 son (10)      Lives: with husband, son, dog.      Employment:  [employer] x 20 years      Tobacco; none      Alcohol: none      Drugs: none      Exercise: some   Family History  Problem Relation Age of Onset  . Multiple sclerosis Mother   . Diabetes Mother   . Heart disease Father 3655  AMI x 2; stenting; abdominal aneurysm  . Hyperlipidemia Father   . Hypertension Father   . Deep vein thrombosis Sister   . Factor V Leiden deficiency Sister        Objective:    BP 160/84 mmHg  Pulse 73  Temp(Src) 97.9 F (36.6 C) (Oral)  Resp 16  Ht 5' 3.5" (1.613 m)  Wt 192 lb 12.8 oz (87.454 kg)  BMI 33.61 kg/m2  LMP 05/06/2014 Physical Exam  Constitutional: She is oriented to person, place, and time. She appears well-developed and well-nourished. No distress.  HENT:  Head: Normocephalic and atraumatic.  Right Ear: External ear normal.  Left Ear: External ear normal.  Nose: Nose normal.  Mouth/Throat: Oropharynx is clear and moist.  Eyes:  Conjunctivae and EOM are normal. Pupils are equal, round, and reactive to light.  Neck: Normal range of motion. Neck supple. Carotid bruit is not present. No thyromegaly present.  Cardiovascular: Normal rate, regular rhythm, normal heart sounds and intact distal pulses.  Exam reveals no gallop and no friction rub.   No murmur heard. Pulmonary/Chest: Effort normal and breath sounds normal. She has no wheezes. She has no rales.  Abdominal: Soft. Bowel sounds are normal. She exhibits no distension and no mass. There is no tenderness. There is no rebound and no guarding.  Lymphadenopathy:    She has no cervical adenopathy.  Neurological: She is alert and oriented to person, place, and time. No cranial nerve deficit.  Skin: Skin is warm and dry. No rash noted. She is not diaphoretic. No erythema. No pallor.  Psychiatric: She has a normal mood and affect. Her behavior is normal.   Results for orders placed or performed in visit on 01/12/15  HM MAMMOGRAPHY  Result Value Ref Range   HM Mammogram negative- repeat in one year        Assessment & Plan:   1. Essential hypertension, benign   2. Seasonal allergic rhinitis due to pollen   3. Adjustment disorder with mixed anxiety and depressed mood   4. Obesity (BMI 30.0-34.9)    1. HTN: uncontrolled in office; no changes to management at this time; monitor closely at home; call if readings remain > 140/90.  If remains elevated at next visit, will warrant addition of another agent. 2. Allergic Rhinitis: controlled; refill of Flonase provided. 3.  Anxiety: improved; doing well with decreased work stressors. 4.  Obesity: unchanged; highly recommend weight loss, exercise, low-calorie food choices.   No orders of the defined types were placed in this encounter.   Meds ordered this encounter  Medications  . fluticasone (FLONASE) 50 MCG/ACT nasal spray    Sig: Place 2 sprays into both nostrils daily.    Dispense:  48 g    Refill:  3    Return in  about 6 months (around 07/24/2015) for recheck blood pressure.    Damonie Ellenwood Paulita Fujita, M.D. [facility] [address] [place], [place]  [address] [phone] phone [phone] fax

## 2015-01-24 NOTE — Patient Instructions (Signed)
DASH Eating Plan  DASH stands for "Dietary Approaches to Stop Hypertension." The DASH eating plan is a healthy eating plan that has been shown to reduce high blood pressure (hypertension). Additional health benefits may include reducing the risk of type 2 diabetes mellitus, heart disease, and stroke. The DASH eating plan may also help with weight loss.  WHAT DO I NEED TO KNOW ABOUT THE DASH EATING PLAN?  For the DASH eating plan, you will follow these general guidelines:  · Choose foods with a percent daily value for sodium of less than 5% (as listed on the food label).  · Use salt-free seasonings or herbs instead of table salt or sea salt.  · Check with your health care provider or pharmacist before using salt substitutes.  · Eat lower-sodium products, often labeled as "lower sodium" or "no salt added."  · Eat fresh foods.  · Eat more vegetables, fruits, and low-fat dairy products.  · Choose whole grains. Look for the word "whole" as the first word in the ingredient list.  · Choose fish and skinless chicken or turkey more often than red meat. Limit fish, poultry, and meat to 6 oz (170 g) each day.  · Limit sweets, desserts, sugars, and sugary drinks.  · Choose heart-healthy fats.  · Limit cheese to 1 oz (28 g) per day.  · Eat more home-cooked food and less restaurant, buffet, and fast food.  · Limit fried foods.  · Cook foods using methods other than frying.  · Limit canned vegetables. If you do use them, rinse them well to decrease the sodium.  · When eating at a restaurant, ask that your food be prepared with less salt, or no salt if possible.  WHAT FOODS CAN I EAT?  Seek help from a dietitian for individual calorie needs.  Grains  Whole grain or whole wheat bread. Brown rice. Whole grain or whole wheat pasta. Quinoa, bulgur, and whole grain cereals. Low-sodium cereals. Corn or whole wheat flour tortillas. Whole grain cornbread. Whole grain crackers. Low-sodium crackers.  Vegetables  Fresh or frozen vegetables  (raw, steamed, roasted, or grilled). Low-sodium or reduced-sodium tomato and vegetable juices. Low-sodium or reduced-sodium tomato sauce and paste. Low-sodium or reduced-sodium canned vegetables.   Fruits  All fresh, canned (in natural juice), or frozen fruits.  Meat and Other Protein Products  Ground beef (85% or leaner), grass-fed beef, or beef trimmed of fat. Skinless chicken or turkey. Ground chicken or turkey. Pork trimmed of fat. All fish and seafood. Eggs. Dried beans, peas, or lentils. Unsalted nuts and seeds. Unsalted canned beans.  Dairy  Low-fat dairy products, such as skim or 1% milk, 2% or reduced-fat cheeses, low-fat ricotta or cottage cheese, or plain low-fat yogurt. Low-sodium or reduced-sodium cheeses.  Fats and Oils  Tub margarines without trans fats. Light or reduced-fat mayonnaise and salad dressings (reduced sodium). Avocado. Safflower, olive, or canola oils. Natural peanut or almond butter.  Other  Unsalted popcorn and pretzels.  The items listed above may not be a complete list of recommended foods or beverages. Contact your dietitian for more options.  WHAT FOODS ARE NOT RECOMMENDED?  Grains  White bread. White pasta. White rice. Refined cornbread. Bagels and croissants. Crackers that contain trans fat.  Vegetables  Creamed or fried vegetables. Vegetables in a cheese sauce. Regular canned vegetables. Regular canned tomato sauce and paste. Regular tomato and vegetable juices.  Fruits  Dried fruits. Canned fruit in light or heavy syrup. Fruit juice.  Meat and Other Protein   Products  Fatty cuts of meat. Ribs, chicken wings, bacon, sausage, bologna, salami, chitterlings, fatback, hot dogs, bratwurst, and packaged luncheon meats. Salted nuts and seeds. Canned beans with salt.  Dairy  Whole or 2% milk, cream, half-and-half, and cream cheese. Whole-fat or sweetened yogurt. Full-fat cheeses or blue cheese. Nondairy creamers and whipped toppings. Processed cheese, cheese spreads, or cheese  curds.  Condiments  Onion and garlic salt, seasoned salt, table salt, and sea salt. Canned and packaged gravies. Worcestershire sauce. Tartar sauce. Barbecue sauce. Teriyaki sauce. Soy sauce, including reduced sodium. Steak sauce. Fish sauce. Oyster sauce. Cocktail sauce. Horseradish. Ketchup and mustard. Meat flavorings and tenderizers. Bouillon cubes. Hot sauce. Tabasco sauce. Marinades. Taco seasonings. Relishes.  Fats and Oils  Butter, stick margarine, lard, shortening, ghee, and bacon fat. Coconut, palm kernel, or palm oils. Regular salad dressings.  Other  Pickles and olives. Salted popcorn and pretzels.  The items listed above may not be a complete list of foods and beverages to avoid. Contact your dietitian for more information.  WHERE CAN I FIND MORE INFORMATION?  National Heart, Lung, and Blood Institute: www.nhlbi.nih.gov/health/health-topics/topics/dash/     This information is not intended to replace advice given to you by your health care provider. Make sure you discuss any questions you have with your health care provider.     Document Released: 02/22/2011 Document Revised: 03/26/2014 Document Reviewed: 01/07/2013  Elsevier Interactive Patient Education ©2016 Elsevier Inc.

## 2015-04-27 ENCOUNTER — Telehealth: Payer: Self-pay | Admitting: Family Medicine

## 2015-04-27 NOTE — Telephone Encounter (Signed)
lmom of new appt date with Dr Katrinka Blazing 07-26-2015 @ 1:00

## 2015-07-04 ENCOUNTER — Other Ambulatory Visit: Payer: Self-pay | Admitting: Family Medicine

## 2015-07-25 ENCOUNTER — Ambulatory Visit: Payer: 59 | Admitting: Family Medicine

## 2015-07-26 ENCOUNTER — Encounter: Payer: Self-pay | Admitting: Family Medicine

## 2015-07-26 ENCOUNTER — Ambulatory Visit (INDEPENDENT_AMBULATORY_CARE_PROVIDER_SITE_OTHER): Payer: 59 | Admitting: Family Medicine

## 2015-07-26 VITALS — BP 160/102 | HR 64 | Temp 98.4°F | Resp 16 | Ht 63.5 in | Wt 188.4 lb

## 2015-07-26 DIAGNOSIS — E669 Obesity, unspecified: Secondary | ICD-10-CM | POA: Diagnosis not present

## 2015-07-26 DIAGNOSIS — I10 Essential (primary) hypertension: Secondary | ICD-10-CM

## 2015-07-26 DIAGNOSIS — F4323 Adjustment disorder with mixed anxiety and depressed mood: Secondary | ICD-10-CM

## 2015-07-26 DIAGNOSIS — H1013 Acute atopic conjunctivitis, bilateral: Secondary | ICD-10-CM

## 2015-07-26 DIAGNOSIS — J301 Allergic rhinitis due to pollen: Secondary | ICD-10-CM

## 2015-07-26 NOTE — Progress Notes (Signed)
Subjective:    Patient ID: Amber Nelson, female    DOB: 01/28/1966, 50 y.o.   MRN: [id number]  07/26/2015  Follow-up and Hypertension   HPI This 50 y.o. female presents for six month follow-up:  1.HTN: Patient reports good compliance with medication, good tolerance to medication, and good symptom control.  Not checking Bp at home currently.  Ran 130-140/80s.  Walking some.  Travel a lot with ballgames.  Metoprolol ER 50mg  daily.    2.  Allergic Rhinitis: Patient reports good compliance with medication, good tolerance to medication, and good symptom control.  Terrible currently.  Started Singulair this month; Flonase, Optivar.  Zyrtec 10mg  daily. No allergy shots; hates shots.    3.  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  4.  Obesity:  Not focused on weight loss at this time.  Trying to exercise.   Review of Systems  Constitutional: Negative for fever, chills, diaphoresis and fatigue.  Eyes: Negative for visual disturbance.  Respiratory: Negative for cough and shortness of breath.   Cardiovascular: Negative for chest pain, palpitations and leg swelling.  Gastrointestinal: Negative for nausea, vomiting, abdominal pain, diarrhea and constipation.  Endocrine: Negative for cold intolerance, heat intolerance, polydipsia, polyphagia and polyuria.  Neurological: Negative for dizziness, tremors, seizures, syncope, facial asymmetry, speech difficulty, weakness, light-headedness, numbness and headaches.    Past Medical History  Diagnosis Date  . Allergy   . Anxiety   . Hypertension    Past Surgical History  Procedure Laterality Date  . Abdominal hysterectomy  05/13/14    ovaries intact; DUB, dysmenorrhea.  [facility].  . Cesarean section     Allergies  Allergen Reactions  . Sulfa Antibiotics     Social History   Social History  . Marital Status: Married    Spouse Name: N/A  . Number of Children: N/A  . Years of  Education: N/A   Occupational History  . Not on file.   Social History Main Topics  . Smoking status: Never Smoker   . Smokeless tobacco: Never Used  . Alcohol Use: 0.6 oz/week    1 Cans of beer per week  . Drug Use: No  . Sexual Activity: Yes    Birth Control/ Protection: Surgical   Other Topics Concern  . Not on file   Social History Narrative   Marital status: married x 26 years      Children:  1 son (10)      Lives: with husband, son, dog.      Employment:  [employer] x 20 years      Tobacco; none      Alcohol: none      Drugs: none      Exercise: some   Family History  Problem Relation Age of Onset  . Multiple sclerosis Mother   . Diabetes Mother   . Heart disease Father [id number]    AMI x 2; stenting; abdominal aneurysm  . Hyperlipidemia Father   . Hypertension Father   . Deep vein thrombosis Sister   . Factor V Leiden deficiency Sister        Objective:    BP 160/102 mmHg  Pulse 64  Temp(Src) 98.4 F (36.9 C) (Oral)  Resp 16  Ht 5' 3.5" (1.613 m)  Wt 188 lb 6.4 oz (85.458 kg)  BMI 32.85 kg/m2  SpO2 97%  LMP 05/06/2014 Physical Exam  Constitutional: She is oriented to person, place, and time. She appears  well-developed and well-nourished. No distress.  HENT:  Head: Normocephalic and atraumatic.  Right Ear: External ear normal.  Left Ear: External ear normal.  Nose: Nose normal.  Mouth/Throat: Oropharynx is clear and moist.  Eyes: Conjunctivae and EOM are normal. Pupils are equal, round, and reactive to light.  Neck: Normal range of motion. Neck supple. Carotid bruit is not present. No thyromegaly present.  Cardiovascular: Normal rate, regular rhythm, normal heart sounds and intact distal pulses.  Exam reveals no gallop and no friction rub.   No murmur heard. Pulmonary/Chest: Effort normal and breath sounds normal. She has no wheezes. She has no rales.  Abdominal: Soft. Bowel sounds are normal. She exhibits no distension and no mass. There is no tenderness.  There is no rebound and no guarding.  Lymphadenopathy:    She has no cervical adenopathy.  Neurological: She is alert and oriented to person, place, and time. No cranial nerve deficit.  Skin: Skin is warm and dry. No rash noted. She is not diaphoretic. No erythema. No pallor.  Psychiatric: She has a normal mood and affect. Her behavior is normal.   Results for orders placed or performed in visit on 01/12/15  HM MAMMOGRAPHY  Result Value Ref Range   HM Mammogram negative- repeat in one year        Assessment & Plan:   1. Essential hypertension, benign   2. Seasonal allergic rhinitis due to pollen   3. Allergic conjunctivitis, bilateral   4. Adjustment disorder with mixed anxiety and depressed mood   5. Obesity (BMI 30.0-34.9)    -uncontrolled HTN; reports white coat syndrome. -to check BP at home weekly and call with readings. -highly recommend weight loss, exercise, low-calorie food choices; limit total caloric intake to 1200kcal/day.   No orders of the defined types were placed in this encounter.   No orders of the defined types were placed in this encounter.    Return in about 3 months (around 10/26/2015) for recheck high blood pressure.    Weslynn Ke Paulita Fujita, M.D. [facility] [address] [place], [place]  [address] [phone] phone [phone] fax

## 2015-07-26 NOTE — Patient Instructions (Addendum)
   IF you received an x-ray today, you will receive an invoice from [facility]. Please contact [facility] at [phone] with questions or concerns regarding your invoice.   IF you received labwork today, you will receive an invoice from [facility]. Please contact [facility] at [phone] with questions or concerns regarding your invoice.   Our billing staff will not be able to assist you with questions regarding bills from these companies.  You will be contacted with the lab results as soon as they are available. The fastest way to get your results is to activate your My Chart account. Instructions are located on the last page of this paperwork. If you have not heard from us regarding the results in 2 weeks, please contact this office.     DASH Eating Plan DASH stands for "Dietary Approaches to Stop Hypertension." The DASH eating plan is a healthy eating plan that has been shown to reduce high blood pressure (hypertension). Additional health benefits may include reducing the risk of type 2 diabetes mellitus, heart disease, and stroke. The DASH eating plan may also help with weight loss. WHAT DO I NEED TO KNOW ABOUT THE DASH EATING PLAN? For the DASH eating plan, you will follow these general guidelines:  Choose foods with a percent daily value for sodium of less than 5% (as listed on the food label).  Use salt-free seasonings or herbs instead of table salt or sea salt.  Check with your health care provider or pharmacist before using salt substitutes.  Eat lower-sodium products, often labeled as "lower sodium" or "no salt added."  Eat fresh foods.  Eat more vegetables, fruits, and low-fat dairy products.  Choose whole grains. Look for the word "whole" as the first word in the ingredient list.  Choose fish and skinless chicken or turkey more often than red meat. Limit fish, poultry, and meat to 6 oz (170 g) each day.  Limit sweets,  desserts, sugars, and sugary drinks.  Choose heart-healthy fats.  Limit cheese to 1 oz (28 g) per day.  Eat more home-cooked food and less restaurant, buffet, and fast food.  Limit fried foods.  Cook foods using methods other than frying.  Limit canned vegetables. If you do use them, rinse them well to decrease the sodium.  When eating at a restaurant, ask that your food be prepared with less salt, or no salt if possible. WHAT FOODS CAN I EAT? Seek help from a dietitian for individual calorie needs. Grains Whole grain or whole wheat bread. Brown rice. Whole grain or whole wheat pasta. Quinoa, bulgur, and whole grain cereals. Low-sodium cereals. Corn or whole wheat flour tortillas. Whole grain cornbread. Whole grain crackers. Low-sodium crackers. Vegetables Fresh or frozen vegetables (raw, steamed, roasted, or grilled). Low-sodium or reduced-sodium tomato and vegetable juices. Low-sodium or reduced-sodium tomato sauce and paste. Low-sodium or reduced-sodium canned vegetables.  Fruits All fresh, canned (in natural juice), or frozen fruits. Meat and Other Protein Products Ground beef (85% or leaner), grass-fed beef, or beef trimmed of fat. Skinless chicken or turkey. Ground chicken or turkey. Pork trimmed of fat. All fish and seafood. Eggs. Dried beans, peas, or lentils. Unsalted nuts and seeds. Unsalted canned beans. Dairy Low-fat dairy products, such as skim or 1% milk, 2% or reduced-fat cheeses, low-fat ricotta or cottage cheese, or plain low-fat yogurt. Low-sodium or reduced-sodium cheeses. Fats and Oils Tub margarines without trans fats. Light or reduced-fat mayonnaise and salad dressings (reduced sodium). Avocado. Safflower, olive, or canola   oils. Natural peanut or almond butter. Other Unsalted popcorn and pretzels. The items listed above may not be a complete list of recommended foods or beverages. Contact your dietitian for more options. WHAT FOODS ARE NOT  RECOMMENDED? Grains White bread. White pasta. White rice. Refined cornbread. Bagels and croissants. Crackers that contain trans fat. Vegetables Creamed or fried vegetables. Vegetables in a cheese sauce. Regular canned vegetables. Regular canned tomato sauce and paste. Regular tomato and vegetable juices. Fruits Dried fruits. Canned fruit in light or heavy syrup. Fruit juice. Meat and Other Protein Products Fatty cuts of meat. Ribs, chicken wings, bacon, sausage, bologna, salami, chitterlings, fatback, hot dogs, bratwurst, and packaged luncheon meats. Salted nuts and seeds. Canned beans with salt. Dairy Whole or 2% milk, cream, half-and-half, and cream cheese. Whole-fat or sweetened yogurt. Full-fat cheeses or blue cheese. Nondairy creamers and whipped toppings. Processed cheese, cheese spreads, or cheese curds. Condiments Onion and garlic salt, seasoned salt, table salt, and sea salt. Canned and packaged gravies. Worcestershire sauce. Tartar sauce. Barbecue sauce. Teriyaki sauce. Soy sauce, including reduced sodium. Steak sauce. Fish sauce. Oyster sauce. Cocktail sauce. Horseradish. Ketchup and mustard. Meat flavorings and tenderizers. Bouillon cubes. Hot sauce. Tabasco sauce. Marinades. Taco seasonings. Relishes. Fats and Oils Butter, stick margarine, lard, shortening, ghee, and bacon fat. Coconut, palm kernel, or palm oils. Regular salad dressings. Other Pickles and olives. Salted popcorn and pretzels. The items listed above may not be a complete list of foods and beverages to avoid. Contact your dietitian for more information. WHERE CAN I FIND MORE INFORMATION? National Heart, Lung, and Blood Institute: www.nhlbi.nih.gov/health/health-topics/topics/dash/   This information is not intended to replace advice given to you by your health care provider. Make sure you discuss any questions you have with your health care provider.   Document Released: 02/22/2011 Document Revised: 03/26/2014  Document Reviewed: 01/07/2013 Elsevier Interactive Patient Education 2016 Elsevier Inc.  

## 2015-09-24 ENCOUNTER — Other Ambulatory Visit: Payer: Self-pay | Admitting: Family Medicine

## 2015-10-28 ENCOUNTER — Encounter: Payer: Self-pay | Admitting: Family Medicine

## 2015-11-23 ENCOUNTER — Encounter: Payer: Self-pay | Admitting: Family Medicine

## 2015-11-30 ENCOUNTER — Telehealth: Payer: Self-pay

## 2015-11-30 NOTE — Telephone Encounter (Signed)
[facility]  I haven't received my form for the BMI alternative for [facility]. Can you let me know if that was emailed to me? My email is [email or web address] and my number is [phone].     Thanks

## 2015-12-10 ENCOUNTER — Encounter: Payer: Self-pay | Admitting: Family Medicine

## 2015-12-10 ENCOUNTER — Ambulatory Visit (INDEPENDENT_AMBULATORY_CARE_PROVIDER_SITE_OTHER): Payer: 59 | Admitting: Family Medicine

## 2015-12-10 VITALS — BP 126/74 | HR 74 | Temp 97.8°F | Resp 18 | Ht 63.5 in | Wt 191.0 lb

## 2015-12-10 DIAGNOSIS — J301 Allergic rhinitis due to pollen: Secondary | ICD-10-CM

## 2015-12-10 DIAGNOSIS — I10 Essential (primary) hypertension: Secondary | ICD-10-CM

## 2015-12-10 DIAGNOSIS — E669 Obesity, unspecified: Secondary | ICD-10-CM

## 2015-12-10 DIAGNOSIS — J0101 Acute recurrent maxillary sinusitis: Secondary | ICD-10-CM | POA: Diagnosis not present

## 2015-12-10 MED ORDER — PREDNISONE 20 MG PO TABS: ORAL_TABLET | ORAL | 0 refills | Status: DC

## 2015-12-10 MED ORDER — AMOXICILLIN-POT CLAVULANATE 875-125 MG PO TABS: 1.0000 | ORAL_TABLET | Freq: Two times a day (BID) | ORAL | 0 refills | Status: DC

## 2015-12-10 NOTE — Progress Notes (Signed)
By signing my name below, I, Mesha Guinyard, attest that this documentation has been prepared under the direction and in the presence of Nilda Simmer, MD.  Electronically Signed: Arvilla Market, Medical Scribe. 12/10/15. 9:14 AM.  Subjective:    Patient ID: Amber Nelson, female    DOB: 30-Jun-1965, 50 y.o.   MRN: [id number]   12/10/2015  Sinusitis   HPI  HPI Comments: Amber Nelson is a 50 y.o. female who presents to the [facility] complaining of sinus congestion with yellow and greens sputum onset Monday (5 days ago). Pt states her symptoms felt like allergy related on Monday, and Wednesday is when it felt badly, and states it was worse Thursday 2 days ago. Pt reports associated symptoms of swollen lymph nodes onset yesterday, diaphoresis, HA, cough, ear pain, sore throat, and SOB with coughing. Pt has been taking flonase, mucinex, singulair, and zyrtec for little to no relief to her symptoms. Pt denies wheezing, and chills.  Skin CA/melanoma: Pt states she had skin CA that initially looked like a zit. Pt went to a dermatologist to get rid of her melanoma in July. Pt has 17 stiches after the procedure.  Obesity: requesting [facility] form be completed regarding obesity and lack of weight loss in the past year.  She is currently not exercising regularly.  HTN: thrilled with blood pressure today.  Not checking at home.  Review of Systems  Constitutional: Positive for diaphoresis. Negative for chills, fatigue and fever.  HENT: Positive for congestion, ear pain, facial swelling (maxillary lymph nodes), sinus pressure and sore throat. Negative for trouble swallowing and voice change.   Eyes: Negative for visual disturbance.  Respiratory: Positive for cough and shortness of breath. Negative for wheezing.   Cardiovascular: Negative for chest pain, palpitations and leg swelling.  Gastrointestinal: Negative for abdominal pain, constipation, diarrhea, nausea and  vomiting.  Endocrine: Negative for cold intolerance, heat intolerance, polydipsia, polyphagia and polyuria.  Neurological: Positive for headaches. Negative for dizziness, tremors, seizures, syncope, facial asymmetry, speech difficulty, weakness, light-headedness and numbness.   Past Medical History:  Diagnosis Date  . Allergy   . Anxiety   . Hypertension    Past Surgical History:  Procedure Laterality Date  . ABDOMINAL HYSTERECTOMY  05/13/14   ovaries intact; DUB, dysmenorrhea.  [facility].  . CESAREAN SECTION     Allergies  Allergen Reactions  . Sulfa Antibiotics    Current Outpatient Prescriptions  Medication Sig Dispense Refill  . ALPRAZolam (XANAX) 0.5 MG tablet Take 0.25-0.5 mg by mouth at bedtime as needed for anxiety.    Marland Kitchen azelastine (OPTIVAR) 0.05 % ophthalmic solution Place 1 drop into both eyes 2 (two) times daily. 6 mL 12  . cetirizine (ZYRTEC) 10 MG tablet Take 10 mg by mouth daily.    . fluticasone (FLONASE) 50 MCG/ACT nasal spray Place 2 sprays into both nostrils daily. 48 g 3  . metoprolol succinate (TOPROL-XL) 50 MG 24 hr tablet TAKE 1 TABLET BY MOUTH  DAILY WITH OR IMMEDIATLEY  FOLLOWING A MEAL 90 tablet 0  . montelukast (SINGULAIR) 10 MG tablet Take 1 tablet by mouth at  bedtime 90 tablet 3  . OVER THE COUNTER MEDICATION daily.    Marland Kitchen amoxicillin-clavulanate (AUGMENTIN) 875-125 MG tablet Take 1 tablet by mouth 2 (two) times daily. 20 tablet 0  . predniSONE (DELTASONE) 20 MG tablet Take 3 PO QAM x 1 day, 2 PO QAM x 5 days, 1 PO QAM x 5 days 18 tablet 0  No current facility-administered medications for this visit.    Social History   Social History  . Marital status: Married    Spouse name: N/A  . Number of children: N/A  . Years of education: N/A   Occupational History  . Not on file.   Social History Main Topics  . Smoking status: Never Smoker  . Smokeless tobacco: Never Used  . Alcohol use 0.6 oz/week    1 Cans of beer per week  . Drug use: No  . Sexual  activity: Yes    Birth control/ protection: Surgical   Other Topics Concern  . Not on file   Social History Narrative   Marital status: married x 26 years      Children:  1 son (10)      Lives: with husband, son, dog.      Employment:  [facility] x 20 years      Tobacco; none      Alcohol: none      Drugs: none      Exercise: some   Family History  Problem Relation Age of Onset  . Multiple sclerosis Mother   . Diabetes Mother   . Heart disease Father 4255    AMI x 2; stenting; abdominal aneurysm  . Hyperlipidemia Father   . Hypertension Father   . Deep vein thrombosis Sister   . Factor V Leiden deficiency Sister    Objective:    BP 126/74 (BP Location: Right Arm, Patient Position: Sitting, Cuff Size: Small)   Pulse 74   Temp 97.8 F (36.6 C) (Oral)   Resp 18   Ht 5' 3.5" (1.613 m)   Wt 191 lb (86.6 kg)   LMP 05/06/2014   SpO2 95%   BMI 33.30 kg/m  Physical Exam  Constitutional: She is oriented to person, place, and time. She appears well-developed and well-nourished. No distress.  HENT:  Head: Normocephalic and atraumatic.  Right Ear: External ear normal.  Left Ear: External ear normal.  Mouth/Throat: Oropharyngeal exudate present.  Rt maxilary tenderness  Eyes: Conjunctivae are normal. Pupils are equal, round, and reactive to light.  Neck: Normal range of motion. Neck supple. No thyromegaly present.  Cardiovascular: Normal rate, regular rhythm and normal heart sounds.  Exam reveals no gallop and no friction rub.   No murmur heard. Pulmonary/Chest: Effort normal and breath sounds normal. No respiratory distress. She has no wheezes. She has no rales.  Lymphadenopathy:    She has cervical adenopathy (right).  Neurological: She is alert and oriented to person, place, and time.  Skin: Skin is warm and dry. No rash noted. She is not diaphoretic.  Psychiatric: She has a normal mood and affect. Her behavior is normal.  Nursing note and vitals reviewed.  Results for  orders placed or performed in visit on 01/12/15  [facility] MAMMOGRAPHY  Result Value Ref Range   [facility] Mammogram negative- repeat in one year     Assessment & Plan:   1. Acute recurrent maxillary sinusitis   2. Essential hypertension, benign   3. Seasonal allergic rhinitis due to pollen   4. Obesity (BMI 30.0-34.9)    -New. -Rx for Augmentin and Prednisone provided. -followed by allergy/immunology for chronic allergic rhinitis; continue current regimen. -blood pressure well controlled. -work form completion during visit for obesity; recommend [employer] Company[employer], exercising five days per week, 1200Kcal per day limitation.   No orders of the defined types were placed in this encounter.  Meds ordered this encounter  Medications  .  cetirizine (ZYRTEC) 10 MG tablet    Sig: Take 10 mg by mouth daily.  Marland Kitchen amoxicillin-clavulanate (AUGMENTIN) 875-125 MG tablet    Sig: Take 1 tablet by mouth 2 (two) times daily.    Dispense:  20 tablet    Refill:  0  . predniSONE (DELTASONE) 20 MG tablet    Sig: Take 3 PO QAM x 1 day, 2 PO QAM x 5 days, 1 PO QAM x 5 days    Dispense:  18 tablet    Refill:  0    Return if symptoms worsen or fail to improve.  I personally performed the services described in this documentation, which was scribed in my presence. The recorded information has been reviewed and considered.  Saahas Hidrogo Paulita Fujita, M.D. [facility] [address] [place], [place]  [address] [phone] phone [phone] fax

## 2015-12-10 NOTE — Patient Instructions (Addendum)
   IF you received an x-ray today, you will receive an invoice from [facility]. Please contact [facility] at [phone] with questions or concerns regarding your invoice.   IF you received labwork today, you will receive an invoice from [facility]. Please contact [facility] at [phone] with questions or concerns regarding your invoice.   Our billing staff will not be able to assist you with questions regarding bills from these companies.  You will be contacted with the lab results as soon as they are available. The fastest way to get your results is to activate your My Chart account. Instructions are located on the last page of this paperwork. If you have not heard from us regarding the results in 2 weeks, please contact this office.     Sinusitis, Adult Sinusitis is redness, soreness, and inflammation of the paranasal sinuses. Paranasal sinuses are air pockets within the bones of your face. They are located beneath your eyes, in the middle of your forehead, and above your eyes. In healthy paranasal sinuses, mucus is able to drain out, and air is able to circulate through them by way of your nose. However, when your paranasal sinuses are inflamed, mucus and air can become trapped. This can allow bacteria and other germs to grow and cause infection. Sinusitis can develop quickly and last only a short time (acute) or continue over a long period (chronic). Sinusitis that lasts for more than 12 weeks is considered chronic. CAUSES Causes of sinusitis include:  Allergies.  Structural abnormalities, such as displacement of the cartilage that separates your nostrils (deviated septum), which can decrease the air flow through your nose and sinuses and affect sinus drainage.  Functional abnormalities, such as when the small hairs (cilia) that line your sinuses and help remove mucus do not work properly or are not present. SIGNS AND SYMPTOMS Symptoms  of acute and chronic sinusitis are the same. The primary symptoms are pain and pressure around the affected sinuses. Other symptoms include:  Upper toothache.  Earache.  Headache.  Bad breath.  Decreased sense of smell and taste.  A cough, which worsens when you are lying flat.  Fatigue.  Fever.  Thick drainage from your nose, which often is green and may contain pus (purulent).  Swelling and warmth over the affected sinuses. DIAGNOSIS Your health care provider will perform a physical exam. During your exam, your health care provider may perform any of the following to help determine if you have acute sinusitis or chronic sinusitis:  Look in your nose for signs of abnormal growths in your nostrils (nasal polyps).  Tap over the affected sinus to check for signs of infection.  View the inside of your sinuses using an imaging device that has a light attached (endoscope). If your health care provider suspects that you have chronic sinusitis, one or more of the following tests may be recommended:  Allergy tests.  Nasal culture. A sample of mucus is taken from your nose, sent to a lab, and screened for bacteria.  Nasal cytology. A sample of mucus is taken from your nose and examined by your health care provider to determine if your sinusitis is related to an allergy. TREATMENT Most cases of acute sinusitis are related to a viral infection and will resolve on their own within 10 days. Sometimes, medicines are prescribed to help relieve symptoms of both acute and chronic sinusitis. These may include pain medicines, decongestants, nasal steroid sprays, or saline sprays. However, for sinusitis related   to a bacterial infection, your health care provider will prescribe antibiotic medicines. These are medicines that will help kill the bacteria causing the infection. Rarely, sinusitis is caused by a fungal infection. In these cases, your health care provider will prescribe antifungal  medicine. For some cases of chronic sinusitis, surgery is needed. Generally, these are cases in which sinusitis recurs more than 3 times per year, despite other treatments. HOME CARE INSTRUCTIONS  Drink plenty of water. Water helps thin the mucus so your sinuses can drain more easily.  Use a humidifier.  Inhale steam 3-4 times a day (for example, sit in the bathroom with the shower running).  Apply a warm, moist washcloth to your face 3-4 times a day, or as directed by your health care provider.  Use saline nasal sprays to help moisten and clean your sinuses.  Take medicines only as directed by your health care provider.  If you were prescribed either an antibiotic or antifungal medicine, finish it all even if you start to feel better. SEEK IMMEDIATE MEDICAL CARE IF:  You have increasing pain or severe headaches.  You have nausea, vomiting, or drowsiness.  You have swelling around your face.  You have vision problems.  You have a stiff neck.  You have difficulty breathing.   This information is not intended to replace advice given to you by your health care provider. Make sure you discuss any questions you have with your health care provider.   Document Released: 03/05/2005 Document Revised: 03/26/2014 Document Reviewed: 03/20/2011 Elsevier Interactive Patient Education 2016 Elsevier Inc.  

## 2015-12-12 NOTE — Telephone Encounter (Signed)
Form provided to patient during office visit on 12/10/15.

## 2015-12-15 ENCOUNTER — Other Ambulatory Visit: Payer: Self-pay | Admitting: Family Medicine

## 2015-12-17 NOTE — Telephone Encounter (Signed)
Sent 60 tabs- pt has appointment 01/31/2016

## 2015-12-17 NOTE — Telephone Encounter (Signed)
Sent 60 tabs - appt 01/31/2016

## 2016-01-14 ENCOUNTER — Other Ambulatory Visit: Payer: Self-pay | Admitting: Family Medicine

## 2016-01-31 ENCOUNTER — Ambulatory Visit (INDEPENDENT_AMBULATORY_CARE_PROVIDER_SITE_OTHER): Payer: 59 | Admitting: Family Medicine

## 2016-01-31 VITALS — BP 132/94 | HR 76 | Temp 98.4°F | Resp 16 | Ht 63.5 in | Wt 197.0 lb

## 2016-01-31 DIAGNOSIS — K429 Umbilical hernia without obstruction or gangrene: Secondary | ICD-10-CM | POA: Diagnosis not present

## 2016-01-31 DIAGNOSIS — J301 Allergic rhinitis due to pollen: Secondary | ICD-10-CM | POA: Diagnosis not present

## 2016-01-31 DIAGNOSIS — E6609 Other obesity due to excess calories: Secondary | ICD-10-CM

## 2016-01-31 DIAGNOSIS — Z6834 Body mass index (BMI) 34.0-34.9, adult: Secondary | ICD-10-CM | POA: Diagnosis not present

## 2016-01-31 DIAGNOSIS — I10 Essential (primary) hypertension: Secondary | ICD-10-CM

## 2016-01-31 LAB — POCT URINALYSIS DIP (MANUAL ENTRY)
BILIRUBIN UA: NEGATIVE
BILIRUBIN UA: NEGATIVE
GLUCOSE UA: NEGATIVE
Leukocytes, UA: NEGATIVE
Nitrite, UA: NEGATIVE
Protein Ur, POC: NEGATIVE
RBC UA: NEGATIVE
SPEC GRAV UA: 1.02
Urobilinogen, UA: 0.2
pH, UA: 5.5

## 2016-01-31 MED ORDER — LOSARTAN POTASSIUM 50 MG PO TABS: 50.0000 mg | ORAL_TABLET | Freq: Every day | ORAL | 1 refills | Status: DC

## 2016-01-31 NOTE — Patient Instructions (Signed)
     IF you received an x-ray today, you will receive an invoice from [facility]. Please contact [facility] at [phone] with questions or concerns regarding your invoice.   IF you received labwork today, you will receive an invoice from [facility]. Please contact [facility] at [phone] with questions or concerns regarding your invoice.   Our billing staff will not be able to assist you with questions regarding bills from these companies.  You will be contacted with the lab results as soon as they are available. The fastest way to get your results is to activate your My Chart account. Instructions are located on the last page of this paperwork. If you have not heard from us regarding the results in 2 weeks, please contact this office.      

## 2016-01-31 NOTE — Progress Notes (Signed)
Subjective:    Patient ID: Amber Nelson, female    DOB: 12/28/1965, 50 y.o.   MRN: [id number]  01/31/2016  Hypertension and abdomen ("feels like a marvel in the navel x 2 mos")   HPI This 50 y.o. female presents for six month follow-up of hypertension, obesity. Boot camp twice per week; walking other days.  Now that projects have calmed down, now can return back to exercise.  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  Not checking BP at home.    BP Readings from Last 3 Encounters:  01/31/16 (!) 132/94  12/10/15 126/74  07/26/15 (!) 160/102   Wt Readings from Last 3 Encounters:  01/31/16 197 lb (89.4 kg)  12/10/15 191 lb (86.6 kg)  07/26/15 188 lb 6.4 oz (85.5 kg)    Immunization History  Administered Date(s) Administered  . Influenza-Unspecified 01/16/2016    Abdominal bulge: onset two months ago; protrudes with exercise.  Denies pain; denies n/v/d.   Review of Systems  Constitutional: Negative for chills, diaphoresis, fatigue and fever.  HENT: Negative for congestion, postnasal drip, rhinorrhea, sore throat, trouble swallowing and voice change.   Eyes: Negative for visual disturbance.  Respiratory: Negative for cough and shortness of breath.   Cardiovascular: Negative for chest pain, palpitations and leg swelling.  Gastrointestinal: Positive for abdominal distention. Negative for abdominal pain, constipation, diarrhea, nausea and vomiting.  Endocrine: Negative for cold intolerance, heat intolerance, polydipsia, polyphagia and polyuria.  Neurological: Negative for dizziness, tremors, seizures, syncope, facial asymmetry, speech difficulty, weakness, light-headedness, numbness and headaches.  Psychiatric/Behavioral: Negative for dysphoric mood and sleep disturbance. The patient is not nervous/anxious.     Past Medical History:  Diagnosis Date  . Allergy   . Anxiety   . Hypertension    Past Surgical History:  Procedure Laterality Date  .  ABDOMINAL HYSTERECTOMY  05/13/14   ovaries intact; DUB, dysmenorrhea.  [facility].  . CESAREAN SECTION     Allergies  Allergen Reactions  . Sulfa Antibiotics    Current Outpatient Prescriptions  Medication Sig Dispense Refill  . ALPRAZolam (XANAX) 0.5 MG tablet Take 0.25-0.5 mg by mouth at bedtime as needed for anxiety.    Marland Kitchen. azelastine (OPTIVAR) 0.05 % ophthalmic solution Place 1 drop into both eyes 2 (two) times daily. 6 mL 12  . cetirizine (ZYRTEC) 10 MG tablet Take 10 mg by mouth daily.    . fluticasone (FLONASE) 50 MCG/ACT nasal spray Place 2 sprays into both nostrils daily. 48 g 3  . metoprolol succinate (TOPROL-XL) 50 MG 24 hr tablet TAKE 1 TABLET BY MOUTH  DAILY WITH OR IMMEDIATLEY  FOLLOWING A MEAL 60 tablet 0  . OVER THE COUNTER MEDICATION daily.    Marland Kitchen. losartan (COZAAR) 50 MG tablet Take 1 tablet (50 mg total) by mouth daily. 30 tablet 1  . montelukast (SINGULAIR) 10 MG tablet Take 1 tablet by mouth at  bedtime (Patient not taking: Reported on 01/31/2016) 90 tablet 3   No current facility-administered medications for this visit.    Social History   Social History  . Marital status: Married    Spouse name: N/A  . Number of children: N/A  . Years of education: N/A   Occupational History  . Not on file.   Social History Main Topics  . Smoking status: Never Smoker  . Smokeless tobacco: Never Used  . Alcohol use 0.6 oz/week    1 Cans of beer per week  . Drug use: No  . Sexual activity:  Yes    Birth control/ protection: Surgical   Other Topics Concern  . Not on file   Social History Narrative   Marital status: married x 26 years      Children:  1 son (10)      Lives: with husband, son, dog.      Employment:  [employer] x 20 years      Tobacco; none      Alcohol: none      Drugs: none      Exercise: some   Family History  Problem Relation Age of Onset  . Multiple sclerosis Mother   . Diabetes Mother   . Heart disease Father [id number]    AMI x 2; stenting; abdominal  aneurysm  . Hyperlipidemia Father   . Hypertension Father   . Deep vein thrombosis Sister   . Factor V Leiden deficiency Sister        Objective:    BP (!) 132/94 (BP Location: Left Arm, Cuff Size: Normal)   Pulse 76   Temp 98.4 F (36.9 C) (Oral)   Resp 16   Ht 5' 3.5" (1.613 m)   Wt 197 lb (89.4 kg)   LMP 05/06/2014   SpO2 97%   BMI 34.35 kg/m  Physical Exam  Constitutional: She is oriented to person, place, and time. She appears well-developed and well-nourished. No distress.  HENT:  Head: Normocephalic and atraumatic.  Right Ear: External ear normal.  Left Ear: External ear normal.  Nose: Nose normal.  Mouth/Throat: Oropharynx is clear and moist.  Eyes: Conjunctivae and EOM are normal. Pupils are equal, round, and reactive to light.  Neck: Normal range of motion. Neck supple. Carotid bruit is not present. No thyromegaly present.  Cardiovascular: Normal rate, regular rhythm, normal heart sounds and intact distal pulses.  Exam reveals no gallop and no friction rub.   No murmur heard. Pulmonary/Chest: Effort normal and breath sounds normal. She has no wheezes. She has no rales.  Abdominal: Soft. Bowel sounds are normal. She exhibits no distension and no mass. There is no tenderness. There is no rebound and no guarding. A hernia is present.  Small reducible umbilical hernia.  Lymphadenopathy:    She has no cervical adenopathy.  Neurological: She is alert and oriented to person, place, and time. No cranial nerve deficit.  Skin: Skin is warm and dry. No rash noted. She is not diaphoretic. No erythema. No pallor.  Psychiatric: She has a normal mood and affect. Her behavior is normal. Judgment and thought content normal.        Assessment & Plan:   1. Essential hypertension, benign   2. Chronic seasonal allergic rhinitis due to pollen   3. Umbilical hernia without obstruction and without gangrene   4. Class 1 obesity due to excess calories with serious comorbidity and  body mass index (BMI) of 34.0 to 34.9 in adult    -uncontrolled; add Losartan 50mg  daily; continue Metoprolol; recommend weight loss, exercise, and low-calorie food choices. Obtain labs and EKG. -new onset umbilical hernia; recommend surgical correction if becomes symptomatic or enlarges.  Orders Placed This Encounter  Procedures  . CBC with Differential/Platelet  . Comprehensive metabolic panel    Order Specific Question:   Has the patient fasted?    Answer:   No  . TSH  . POCT urinalysis dipstick  . EKG 12-Lead   Meds ordered this encounter  Medications  . losartan (COZAAR) 50 MG tablet    Sig: Take 1 tablet (50  mg total) by mouth daily.    Dispense:  30 tablet    Refill:  1    Return in about 4 weeks (around 02/28/2016) for recheck high blood pressure.   Jolynne Spurgin Paulita Fujita, M.D. [facility] [address] [place], [place]  [address] [phone] phone [phone] fax

## 2016-02-02 LAB — CBC WITH DIFFERENTIAL/PLATELET
BASOS: 1 %
Basophils Absolute: 0 10*3/uL (ref 0.0–0.2)
EOS (ABSOLUTE): 0.2 10*3/uL (ref 0.0–0.4)
EOS: 2 %
HEMATOCRIT: 43.6 % (ref 34.0–46.6)
Hemoglobin: 14.7 g/dL (ref 11.1–15.9)
Immature Grans (Abs): 0 10*3/uL (ref 0.0–0.1)
Immature Granulocytes: 0 %
LYMPHS ABS: 2.9 10*3/uL (ref 0.7–3.1)
Lymphs: 37 %
MCH: 33.7 pg — ABNORMAL HIGH (ref 26.6–33.0)
MCHC: 33.7 g/dL (ref 31.5–35.7)
MCV: 100 fL — AB (ref 79–97)
MONOS ABS: 0.6 10*3/uL (ref 0.1–0.9)
Monocytes: 8 %
NEUTROS ABS: 4.1 10*3/uL (ref 1.4–7.0)
Neutrophils: 52 %
Platelets: 285 10*3/uL (ref 150–379)
RBC: 4.36 x10E6/uL (ref 3.77–5.28)
RDW: 12.7 % (ref 12.3–15.4)
WBC: 7.8 10*3/uL (ref 3.4–10.8)

## 2016-02-02 LAB — COMPREHENSIVE METABOLIC PANEL
A/G RATIO: 1.5 (ref 1.2–2.2)
ALK PHOS: 76 IU/L (ref 39–117)
ALT: 20 IU/L (ref 0–32)
AST: 19 IU/L (ref 0–40)
Albumin: 4.3 g/dL (ref 3.5–5.5)
BUN / CREAT RATIO: 23 (ref 9–23)
BUN: 19 mg/dL (ref 6–24)
Bilirubin Total: 0.2 mg/dL (ref 0.0–1.2)
CALCIUM: 9.3 mg/dL (ref 8.7–10.2)
CO2: 24 mmol/L (ref 18–29)
Chloride: 100 mmol/L (ref 96–106)
Creatinine, Ser: 0.82 mg/dL (ref 0.57–1.00)
GFR calc Af Amer: 96 mL/min/{1.73_m2} (ref >59)
GFR, EST NON AFRICAN AMERICAN: 84 mL/min/{1.73_m2} (ref >59)
GLOBULIN, TOTAL: 2.9 g/dL (ref 1.5–4.5)
Glucose: 86 mg/dL (ref 65–99)
POTASSIUM: 4.5 mmol/L (ref 3.5–5.2)
SODIUM: 140 mmol/L (ref 134–144)
Total Protein: 7.2 g/dL (ref 6.0–8.5)

## 2016-02-02 LAB — TSH: TSH: 3.38 u[IU]/mL (ref 0.450–4.500)

## 2016-02-13 ENCOUNTER — Encounter: Payer: Self-pay | Admitting: Family Medicine

## 2016-02-29 ENCOUNTER — Ambulatory Visit: Payer: 59 | Admitting: Family Medicine

## 2016-03-28 ENCOUNTER — Encounter: Payer: Self-pay | Admitting: Family Medicine

## 2016-03-28 ENCOUNTER — Ambulatory Visit (INDEPENDENT_AMBULATORY_CARE_PROVIDER_SITE_OTHER): Payer: 59 | Admitting: Family Medicine

## 2016-03-28 VITALS — BP 150/92 | HR 85 | Temp 98.7°F | Resp 16 | Ht 63.5 in | Wt 198.4 lb

## 2016-03-28 DIAGNOSIS — R1030 Lower abdominal pain, unspecified: Secondary | ICD-10-CM

## 2016-03-28 DIAGNOSIS — I10 Essential (primary) hypertension: Secondary | ICD-10-CM

## 2016-03-28 DIAGNOSIS — S301XXA Contusion of abdominal wall, initial encounter: Secondary | ICD-10-CM

## 2016-03-28 LAB — POCT URINALYSIS DIP (MANUAL ENTRY)
Bilirubin, UA: NEGATIVE
Glucose, UA: NEGATIVE
Ketones, POC UA: NEGATIVE
LEUKOCYTES UA: NEGATIVE
NITRITE UA: NEGATIVE
PH UA: 6.5
PROTEIN UA: NEGATIVE
RBC UA: NEGATIVE
Spec Grav, UA: <=1.005
UROBILINOGEN UA: 0.2

## 2016-03-28 MED ORDER — LOSARTAN POTASSIUM 50 MG PO TABS: 50.0000 mg | ORAL_TABLET | Freq: Every day | ORAL | 1 refills | Status: DC

## 2016-03-28 MED ORDER — METHOCARBAMOL 500 MG PO TABS: 500.0000 mg | ORAL_TABLET | Freq: Four times a day (QID) | ORAL | 0 refills | Status: DC

## 2016-03-28 MED ORDER — METOPROLOL SUCCINATE ER 50 MG PO TB24: ORAL_TABLET | ORAL | 1 refills | Status: DC
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
## 2016-03-28 NOTE — Patient Instructions (Signed)
     IF you received an x-ray today, you will receive an invoice from [facility]. Please contact [facility] at [phone] with questions or concerns regarding your invoice.   IF you received labwork today, you will receive an invoice from [facility]. Please contact [facility] at [phone] with questions or concerns regarding your invoice.   Our billing staff will not be able to assist you with questions regarding bills from these companies.  You will be contacted with the lab results as soon as they are available. The fastest way to get your results is to activate your My Chart account. Instructions are located on the last page of this paperwork. If you have not heard from us regarding the results in 2 weeks, please contact this office.     

## 2016-03-29 LAB — COMPREHENSIVE METABOLIC PANEL
ALK PHOS: 71 IU/L (ref 39–117)
ALT: 17 IU/L (ref 0–32)
AST: 18 IU/L (ref 0–40)
Albumin/Globulin Ratio: 1.5 (ref 1.2–2.2)
Albumin: 4.4 g/dL (ref 3.5–5.5)
BUN/Creatinine Ratio: 16 (ref 9–23)
BUN: 14 mg/dL (ref 6–24)
Bilirubin Total: 0.2 mg/dL (ref 0.0–1.2)
CALCIUM: 9.5 mg/dL (ref 8.7–10.2)
CO2: 20 mmol/L (ref 18–29)
CREATININE: 0.86 mg/dL (ref 0.57–1.00)
Chloride: 100 mmol/L (ref 96–106)
GFR calc Af Amer: 91 mL/min/{1.73_m2} (ref >59)
GFR, EST NON AFRICAN AMERICAN: 79 mL/min/{1.73_m2} (ref >59)
GLOBULIN, TOTAL: 3 g/dL (ref 1.5–4.5)
Glucose: 105 mg/dL — ABNORMAL HIGH (ref 65–99)
Potassium: 4.4 mmol/L (ref 3.5–5.2)
SODIUM: 138 mmol/L (ref 134–144)
Total Protein: 7.4 g/dL (ref 6.0–8.5)

## 2016-03-29 LAB — CBC WITH DIFFERENTIAL/PLATELET
BASOS ABS: 0 10*3/uL (ref 0.0–0.2)
Basos: 0 %
EOS (ABSOLUTE): 0.1 10*3/uL (ref 0.0–0.4)
EOS: 1 %
HEMATOCRIT: 43.4 % (ref 34.0–46.6)
HEMOGLOBIN: 14.4 g/dL (ref 11.1–15.9)
Immature Grans (Abs): 0 10*3/uL (ref 0.0–0.1)
Immature Granulocytes: 0 %
Lymphocytes Absolute: 2.4 10*3/uL (ref 0.7–3.1)
Lymphs: 29 %
MCH: 33.3 pg — ABNORMAL HIGH (ref 26.6–33.0)
MCHC: 33.2 g/dL (ref 31.5–35.7)
MCV: 100 fL — ABNORMAL HIGH (ref 79–97)
MONOCYTES: 8 %
MONOS ABS: 0.7 10*3/uL (ref 0.1–0.9)
NEUTROS PCT: 62 %
Neutrophils Absolute: 5.1 10*3/uL (ref 1.4–7.0)
Platelets: 240 10*3/uL (ref 150–379)
RBC: 4.33 x10E6/uL (ref 3.77–5.28)
RDW: 12.9 % (ref 12.3–15.4)
WBC: 8.2 10*3/uL (ref 3.4–10.8)

## 2016-04-06 ENCOUNTER — Telehealth: Payer: Self-pay
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
## 2016-04-07 NOTE — Telephone Encounter (Signed)
Please write if appropriate.

## 2016-04-09 ENCOUNTER — Telehealth: Payer: Self-pay | Admitting: *Deleted

## 2016-04-09 NOTE — Telephone Encounter (Signed)
Patient called requesting a referral to physical therapy in [place]. Please advise
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
## 2016-04-10 ENCOUNTER — Encounter: Payer: Self-pay | Admitting: Family Medicine

## 2016-04-10 DIAGNOSIS — S43402A Unspecified sprain of left shoulder joint, initial encounter: Secondary | ICD-10-CM

## 2016-04-10 DIAGNOSIS — S335XXA Sprain of ligaments of lumbar spine, initial encounter: Secondary | ICD-10-CM

## 2016-04-11 ENCOUNTER — Telehealth: Payer: Self-pay

## 2016-04-11 ENCOUNTER — Emergency Department: Payer: 59

## 2016-04-11 ENCOUNTER — Encounter: Payer: Self-pay | Admitting: Emergency Medicine

## 2016-04-11 DIAGNOSIS — M7989 Other specified soft tissue disorders: Secondary | ICD-10-CM | POA: Diagnosis present

## 2016-04-11 DIAGNOSIS — Z79899 Other long term (current) drug therapy: Secondary | ICD-10-CM | POA: Insufficient documentation

## 2016-04-11 DIAGNOSIS — I10 Essential (primary) hypertension: Secondary | ICD-10-CM | POA: Diagnosis not present

## 2016-04-11 DIAGNOSIS — I82432 Acute embolism and thrombosis of left popliteal vein: Secondary | ICD-10-CM | POA: Insufficient documentation

## 2016-04-11 NOTE — Telephone Encounter (Signed)
Please see msg below
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
## 2016-04-11 NOTE — Telephone Encounter (Signed)
MyChart message sent to patient for further details.

## 2016-04-11 NOTE — ED Triage Notes (Addendum)
Patient ambulatory to triage with steady gait, without difficulty or distress noted; pt reports left lower leg swelling and pain since last Wednesday; st sent over from urgent care to have u/s; denies accomp symptoms

## 2016-04-11 NOTE — ED Triage Notes (Signed)
Patient sent over by urgent care for US for left leg swelling.
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
## 2016-04-12 ENCOUNTER — Other Ambulatory Visit: Payer: Self-pay | Admitting: Family Medicine

## 2016-04-12 ENCOUNTER — Emergency Department
Admission: EM | Admit: 2016-04-12 | Discharge: 2016-04-12 | Disposition: A | Discharge status: Home / Self Care | Payer: 59 | Attending: Emergency Medicine | Admitting: Emergency Medicine

## 2016-04-12 DIAGNOSIS — I82432 Acute embolism and thrombosis of left popliteal vein: Secondary | ICD-10-CM

## 2016-04-12 MED ORDER — ALPRAZOLAM 0.5 MG PO TABS: 0.2500 mg | ORAL_TABLET | Freq: Every evening | ORAL | 0 refills | Status: AC | PRN

## 2016-04-12 MED ORDER — RIVAROXABAN 15 MG PO TABS
15.0000 mg | ORAL_TABLET | Freq: Once | ORAL | Status: AC
  Administered 2016-04-12: 15 mg via ORAL
  Filled 2016-04-12: qty 1

## 2016-04-12 MED ORDER — RIVAROXABAN (XARELTO) VTE STARTER PACK (15 & 20 MG): ORAL_TABLET | ORAL | 0 refills | Status: DC

## 2016-04-12 NOTE — Telephone Encounter (Signed)
lmtcb

## 2016-04-12 NOTE — Telephone Encounter (Signed)
Seeing dr stallings tomorrow  Developed blood clot 04/11/16 behind left knee,seen in er and treated   Low back pain continues and will talk to her about it then 

## 2016-04-12 NOTE — Telephone Encounter (Signed)
Seeing dr Creta Levinstallings tomorrow  Developed blood clot 04/11/16 behind left knee,seen in er and treated   Low back pain continues and will talk to her about it then

## 2016-04-12 NOTE — ED Provider Notes (Signed)
[facility] Emergency Department Provider Note   First MD Initiated Contact with Patient 04/12/16 [phone]     (approximate)  I have reviewed the triage vital signs and the nursing notes.   HISTORY  Chief Complaint Leg Pain   HPI Amber Nelson is a 51 y.o. female with below of chronic medical conditions presents to the emergency department with left lower extremity pain and swelling 1 week. Patient denies any shortness of breath no chest pain no dizziness. Patient denies any trauma to the lower extremity. Patient denies any prolonged travel or any history of DVT or PE. Patient does admit that her sister has factor V Leiden and a such she was checked for this any years ago to be negative.   Past Medical History:  Diagnosis Date  . Allergy   . Anxiety   . Hypertension     Patient Active Problem List   Diagnosis Date Noted  . Essential hypertension, benign 07/13/2014  . Allergic rhinitis due to pollen 07/13/2014  . Allergic conjunctivitis 07/13/2014  . Obesity (BMI 30.0-34.9) 07/13/2014    Past Surgical History:  Procedure Laterality Date  . ABDOMINAL HYSTERECTOMY  05/13/14   ovaries intact; DUB, dysmenorrhea.  [facility].  . CESAREAN SECTION      Prior to Admission medications   Medication Sig Start Date End Date Taking? Authorizing Provider  ALPRAZolam Prudy Feeler(XANAX) 0.5 MG tablet Take 0.25-0.5 mg by mouth at bedtime as needed for anxiety.    Historical Provider, MD  azelastine (OPTIVAR) 0.05 % ophthalmic solution Place 1 drop into both eyes 2 (two) times daily. 07/12/14   Ethelda ChickKristi M Smith, MD  cetirizine (ZYRTEC) 10 MG tablet Take 10 mg by mouth daily.    Historical Provider, MD  fluticasone (FLONASE) 50 MCG/ACT nasal spray Place 2 sprays into both nostrils daily. 01/24/15   Ethelda ChickKristi M Smith, MD  losartan (COZAAR) 50 MG tablet Take 1 tablet (50 mg total) by mouth daily. 03/28/16   Ethelda ChickKristi M Smith, MD  methocarbamol (ROBAXIN) 500 MG tablet Take 1-2 tablets  (500-1,000 mg total) by mouth 4 (four) times daily. 03/28/16   Ethelda ChickKristi M Smith, MD  metoprolol succinate (TOPROL-XL) 50 MG 24 hr tablet TAKE 1 TABLET BY MOUTH  DAILY WITH OR IMMEDIATLEY  FOLLOWING A MEAL 03/28/16   Ethelda ChickKristi M Smith, MD  montelukast (SINGULAIR) 10 MG tablet Take 1 tablet by mouth at  bedtime 07/23/14   Ethelda ChickKristi M Smith, MD  OVER THE COUNTER MEDICATION daily.    Historical Provider, MD  Rivaroxaban 15 & 20 MG TBPK Take as directed on package: Start with one 15mg  tablet by mouth twice a day with food. On Day 22, switch to one 20mg  tablet once a day with food. 04/12/16   Darci Currentandolph N Berta Denson, MD    Allergies Sulfa antibiotics  Family History  Problem Relation Age of Onset  . Multiple sclerosis Mother   . Diabetes Mother   . Heart disease Father 3155    AMI x 2; stenting; abdominal aneurysm  . Hyperlipidemia Father   . Hypertension Father   . Deep vein thrombosis Sister   . Factor V Leiden deficiency Sister     Social History Social History  Substance Use Topics  . Smoking status: Never Smoker  . Smokeless tobacco: Never Used  . Alcohol use 0.6 oz/week    1 Cans of beer per week    Review of Systems Constitutional: No fever/chills Eyes: No visual changes. ENT: No sore throat. Cardiovascular: Denies chest  pain. Respiratory: Denies shortness of breath. Gastrointestinal: No abdominal pain.  No nausea, no vomiting.  No diarrhea.  No constipation. Genitourinary: Negative for dysuria. Musculoskeletal: Negative for back pain. Skin: Negative for rash. Neurological: Negative for headaches, focal weakness or numbness.  10-point ROS otherwise negative.  ____________________________________________   PHYSICAL EXAM:  VITAL SIGNS: ED Triage Vitals  Enc Vitals Group     BP 04/11/16 2140 (!) 169/88     Pulse Rate 04/11/16 2140 73     Resp 04/11/16 2140 20     Temp 04/11/16 2140 97.8 F (36.6 C)     Temp Source 04/11/16 2140 Oral     SpO2 04/11/16 2140 97 %     Weight 04/11/16  2138 194 lb (88 kg)     Height 04/11/16 2138 5\' 3"  (1.6 m)     Head Circumference --      Peak Flow --      Pain Score 04/11/16 2138 4     Pain Loc --      Pain Edu? --      Excl. in GC? --     Constitutional: Alert and oriented. Well appearing and in no acute distress. Eyes: Conjunctivae are normal. PERRL. EOMI. Head: Atraumatic. Ears:  Healthy appearing ear canals and TMs bilaterally Nose: No congestion/rhinnorhea. Mouth/Throat: Mucous membranes are moist.  Oropharynx non-erythematous. Neck: No stridor.   Cardiovascular: Normal rate, regular rhythm. Good peripheral circulation. Grossly normal heart sounds. Respiratory: Normal respiratory effort.  No retractions. Lungs CTAB. Gastrointestinal: Soft and nontender. No distention.  Musculoskeletal: No lower extremity tenderness nor edema. No gross deformities of extremities. Neurologic:  Normal speech and language. No gross focal neurologic deficits are appreciated.  Skin:  Skin is warm, dry and intact. No rash noted.     RADIOLOGY I, [place] N Tyrees Chopin, personally viewed and evaluated these images (plain radiographs) as part of my medical decision making, as well as reviewing the written report by the radiologist.  US Venous Img Lower Unilateral Left  Result Date: 04/11/2016 CLINICAL DATA:  51 year old female with left calf swelling. EXAM: Left LOWER EXTREMITY VENOUS DOPPLER ULTRASOUND TECHNIQUE: Gray-scale sonography with graded compression, as well as color Doppler and duplex ultrasound were performed to evaluate the lower extremity deep venous systems from the level of the common femoral vein and including the common femoral, femoral, profunda femoral, popliteal and calf veins including the posterior tibial, peroneal and gastrocnemius veins when visible. The superficial great saphenous vein was also interrogated. Spectral Doppler was utilized to evaluate flow at rest and with distal augmentation maneuvers in the common femoral, femoral  and popliteal veins. COMPARISON:  None. FINDINGS: Contralateral Common Femoral Vein: Respiratory phasicity is normal and symmetric with the symptomatic side. No evidence of thrombus. Normal compressibility. Common Femoral Vein: No evidence of thrombus. Normal compressibility, respiratory phasicity and response to augmentation. Saphenofemoral Junction: No evidence of thrombus. Normal compressibility and flow on color Doppler imaging. Profunda Femoral Vein: No evidence of thrombus. Normal compressibility and flow on color Doppler imaging. Femoral Vein: No evidence of thrombus. Normal compressibility, respiratory phasicity and response to augmentation. Popliteal Vein: The proximal portion of the popliteal vein appears patent. There is noncompressibility of the distal portion of the left popliteal vein. There is complete occlusion of this portion of the vessel with echogenic thrombus. Calf Veins: No evidence of thrombus. Normal compressibility and flow on color Doppler imaging. The peroneal vein is not visualized. Superficial Great Saphenous Vein: No evidence of thrombus. Normal compressibility and flow on  color Doppler imaging. Venous Reflux:  None. Other Findings:  None. IMPRESSION: Occlusive DVT in the distal portion of the left popliteal vein. These results were called by telephone at the time of interpretation on 04/11/2016 at 10:47 pm to Dr. Loleta Rose , who verbally acknowledged these results. Electronically Signed   By: Elgie Collard M.D.   On: 04/11/2016 22:50    Procedures      INITIAL IMPRESSION / ASSESSMENT AND PLAN / ED COURSE  Pertinent labs & imaging results that were available during my care of the patient were reviewed by me and considered in my medical decision making (see chart for details).  Patient given Xarelto in emergency Department secondary to left popliteal DVT. Patient has no chest pain no shortness of breath. Patient does have a primary care provider which she will follow  up with today for further outpatient management of left popliteal DVT. Advised to return to emergency department immediately if any chest pain shortness of breath, palpitation or any other emergency medical concerns  Clinical Course as of Apr 12 145  Wed Apr 11, 2016  2252 took call from radiologist to let me know the patient has an occlusive DVT  [CF]    Clinical Course User Index [CF] Loleta Rose, MD    ____________________________________________  FINAL CLINICAL IMPRESSION(S) / ED DIAGNOSES  Final diagnoses:  Acute deep vein thrombosis (DVT) of popliteal vein of left lower extremity (HCC)     MEDICATIONS GIVEN DURING THIS VISIT:  Medications  Rivaroxaban (XARELTO) tablet 15 mg (15 mg Oral Given 04/12/16 0115)     NEW OUTPATIENT MEDICATIONS STARTED DURING THIS VISIT:  New Prescriptions   RIVAROXABAN 15 & 20 MG TBPK    Take as directed on package: Start with one 15mg  tablet by mouth twice a day with food. On Day 22, switch to one 20mg  tablet once a day with food.    Modified Medications   No medications on file    Discontinued Medications   No medications on file     Note:  This document was prepared using Dragon voice recognition software and may include unintentional dictation errors.    Darci Current, MD 04/12/16 [phone]

## 2016-04-12 NOTE — Telephone Encounter (Signed)
I refilled her Flonase for 16g, #1RF. Her last medication refill for it was in 2016 and therefore needs f/u. Patient should discuss this tomorrow with Dr. Creta LevinStallings at her office visit. I will forward this to her.

## 2016-04-13 ENCOUNTER — Ambulatory Visit (INDEPENDENT_AMBULATORY_CARE_PROVIDER_SITE_OTHER): Payer: 59 | Admitting: Family Medicine

## 2016-04-13 VITALS — BP 138/98 | HR 60 | Temp 98.7°F | Resp 16 | Ht 63.0 in | Wt 195.0 lb

## 2016-04-13 DIAGNOSIS — I82432 Acute embolism and thrombosis of left popliteal vein: Secondary | ICD-10-CM

## 2016-04-13 MED ORDER — RIVAROXABAN 20 MG PO TABS: 20.0000 mg | ORAL_TABLET | Freq: Every day | ORAL | 1 refills | Status: DC
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
## 2016-04-13 NOTE — Patient Instructions (Addendum)
IF you received an x-ray today, you will receive an invoice from [facility]. Please contact [facility] at [phone] with questions or concerns regarding your invoice.   IF you received labwork today, you will receive an invoice from [place]. Please contact [facility] at [phone] with questions or concerns regarding your invoice.   Our billing staff will not be able to assist you with questions regarding bills from these companies.  You will be contacted with the lab results as soon as they are available. The fastest way to get your results is to activate your My Chart account. Instructions are located on the last page of this paperwork. If you have not heard from [place] regarding the results in 2 weeks, please contact this office.     Venous Thromboembolism Venous thromboembolism (VTE) is a condition in which a blood clot (thrombus) develops in the body. A thrombus usually occurs in a deep vein in the leg or the pelvis, but it can also occur in the arm. Sometimes, pieces of a thrombus can break off from its original place of development and travel through the bloodstream to other parts of the body. When that happens, the thrombus is called an embolism. An embolism can block the blood flow in the blood vessels of other organs. There are two serious types of VTE:  Deep vein thrombosis (DVT). A DVT is a thrombus that usually occurs in a deep, larger vein of the lower leg or the pelvis, or in an upper extremity such as the arm.  Pulmonary embolism (PE). A PE occurs when an embolism has formed and traveled to the lungs. A PE can block or decrease the blood flow in one lung or both lungs. VTE is a serious health condition that can cause disability or death. It is very important to get help right away and to not ignore symptoms. What are the causes? VTE is caused by the formation of a blood clot in your leg, pelvis, or arm. Usually, several things contribute to the formation  of blood clots. A clot may develop when:  Your blood flow slows down.  Your vein becomes damaged in some way.  You have a condition that makes your blood clot more easily. What increases the risk? A VTE is more likely to develop in:  People who are older, especially over 38 years of age.  People who are overweight (obese).  People who sit or lie still for a long time, such as during long-distance travel (over 4 hours), bed rest, hospitalization, or during recovery from certain medical conditions like a stroke.  People who do not engage in much physical activity (sedentary lifestyle).  People who have chronic breathing disorders.  People who have a personal or family history of blood clots or blood clotting disease.  People who have peripheral vascular disease (PVD), diabetes, or some types of cancer.  People who have heart disease, especially if the person had a recent heart attack or has congestive heart failure.  People who have neurological diseases that affect the legs (leg paresis).  People who have had a traumatic injury, such as breaking a hip or leg.  People who have recently had major or lengthy surgery, especially on the hip, knee, or abdomen.  People who have had a central line placed inside a large vein.  People who take medicines that contain the hormone estrogen. These include birth control pills and hormone replacement therapy.  Pregnancy or during childbirth or the postpartum period.  Long plane flights (over 8 hours). What are the signs or symptoms? Symptoms of VTE can depend on where the clot is located and whether the clot breaks off and travels to another organ. Sometimes, there may be no symptoms. Symptoms of a DVT can include:  Swelling of your leg or arm, especially if one side is much worse.  Warmth and redness of your leg or arm, especially if one side is much worse.  Pain in your arm or leg. If the clot is in your leg, symptoms may be more  noticeable or worse when you stand or walk.  A feeling of pins and needles if the clot is in the arm. The symptoms of a PE usually start suddenly and include:  Shortness of breath while active or at rest.  Coughing or coughing up blood or blood-tinged mucus.  Chest pain that is often worse with deep breaths.  Rapid or irregular heartbeat.  Feeling light-headed or dizzy.  Fainting.  Feeling anxious.  Sweating. There may also be pain and swelling in a leg if that is where the blood clot started. These symptoms may represent a serious problem that is an emergency. Do not wait to see if the symptoms will go away. Get medical help right away. Call your local emergency services (911 in the [place] Do not drive yourself to the hospital.  How is this diagnosed? Your health care provider will take a medical history and perform a physical exam. You may also have other tests, including:  Blood tests to assess the clotting properties of your blood.  Imaging tests, such as CT, ultrasound, MRI, X-ray, and other tests to see if you have clots anywhere in your body.  An electrocardiogram (ECG) to look for heart strain from blood clots in the lungs.  An echocardiogram. How is this treated? After a VTE is identified, it can be treated. The main goals of treatment are:  To stop a blood clot from growing larger.  To stop new blood clots from forming.  To stop a blood clot from traveling to the lungs (pulmonary embolism). The type of treatment that you receive depends on many factors, such as the cause of your VTE, your risk for bleeding or developing more clots, and other medical conditions that you have. Sometimes, a combination of treatments is necessary. Treatment options may be combined and include:  Monitoring the blood clot with ultrasound.  Taking medicines by mouth, such as newer blood thinners (anticoagulants), thrombolytics, or warfarin.  Taking anticoagulant medicine by injection  or through an IV tube.  Wearingcompression stockings or using different types of devices.  Surgery (rare) to remove the blood clot or to place a filter in your abdomen to stop the blood clot from traveling to your lungs. Treatments for VTE are often divided into immediate treatment and long-term treatment (up to 3 months after VTE). You can work with your health care provider to choose the treatment program that is best for you. Follow these instructions at home: If you are taking a newer oral anticoagulant:  Take the medicine every single day at the same time each day.  Understand what foods and drugs interact with this medicine.  Understand that there are no regular blood tests required when using this medicine.  Understand the side effects of this medicine, including excessive bruising or bleeding. Ask your health care provider or pharmacist about other possible side effects. If you are taking warfarin:  Understand how to take warfarin and know which foods  can affect how warfarin works in Public relations account executiveyour body.  Understand that it is dangerous to take too much or too little warfarin. Too much warfarin increases the risk of bleeding. Too little warfarin continues to allow the risk for blood clots.  Follow your PT and INR blood testing schedule. The PT and INR results allow your health care provider to adjust your dose of warfarin. It is very important that you have your PT and INR tested as often as told by your health care provider.  Avoid major changes in your diet, or tell your health care provider before you change your diet. Arrange a visit with a registered dietitian to answer your questions. Many foods, especially foods that are high in vitamin K, can interfere with warfarin and affect the PT and INR results. Eat a consistent amount of foods that are high in vitamin K, such as:  Spinach, kale, broccoli, cabbage, collard greens, turnip greens, Brussels sprouts, peas, cauliflower, seaweed, and  parsley.  Beef liver and pork liver.  Green tea.  Soybean oil.  Tell your health care provider about any and all medicines, vitamins, and supplements that you take, including aspirin and other over-the-counter anti-inflammatory medicines. Be especially cautious with aspirin and anti-inflammatory medicines. Do not take those before you ask your health care provider if it is safe to do so. This is important because many medicines can interfere with warfarin and affect the PT and INR results.  Do not start or stop taking any over-the-counter or prescription medicine unless your health care provider or pharmacist tells you to do so. If you take warfarin, you will also need to do these things:  Hold pressure over cuts for longer than usual.  Tell your dentist and other health care providers that you are taking warfarin before you have any procedures in which bleeding may occur.  Avoid alcohol or drink very small amounts. Tell your health care provider if you change your alcohol intake.  Do not use tobacco products, including cigarettes, chewing tobacco, and e-cigarettes. If you need help quitting, ask your health care provider.  Avoid contact sports. General instructions  Take over-the-counter and prescription medicines only as told by your health care provider. Anticoagulant medicines can have side effects, including easy bruising and difficulty stopping bleeding. If you are prescribed an anticoagulant, you will also need to do these things:  Hold pressure over cuts for longer than usual.  Tell your dentist and other health care providers that you are taking anticoagulants before you have any procedures in which bleeding may occur.  Avoid contact sports.  Wear a medical alert bracelet or carry a medical alert card that says you have had a PE.  Ask your health care provider how soon you can go back to your normal activities. Stay active to prevent new blood clots from forming.  Make  sure to exercise while traveling or when you have been sitting or standing for a long period of time. It is very important to exercise. Exercise your legs by walking or by tightening and relaxing your leg muscles often. Take frequent walks.  Wear compression stockings as told by your health care provider to help prevent more blood clots from forming.  Do not use tobacco products, including cigarettes, chewing tobacco, and e-cigarettes. If you need help quitting, ask your health care provider.  Keep all follow-up appointments with your health care provider. This is important. How is this prevented? Take these actions to decrease your risk of developing another VTE:  Exercise regularly. For at least 30 minutes every day, engage in:  Activity that involves moving your arms and legs.  Activity that encourages good blood flow through your body by increasing your heart rate.  Exercise your arms and legs every hour during long-distance travel (over 4 hours). Drink plenty of water and avoid drinking alcohol while traveling.  Avoid sitting or lying in bed for long periods of time without moving your legs.  Maintain a weight that is appropriate for your height. Ask your health care provider what weight is healthy for you.  If you are a woman who is over 103 years of age, avoid unnecessary use of medicines that contain estrogen. These include birth control pills.  Do not smoke, especially if you take estrogen medicines. If you need help quitting, ask your health care provider. If you are hospitalized, prevention measures may include:  Early walking after surgery, as soon as your health care provider says that it is safe.  Receiving anticoagulants to prevent blood clots.If you cannot take anticoagulants, other options may be available, such as wearing compression stockings or using different types of devices. Get help right away if:  You have new or increased pain, swelling, or redness in an arm  or leg.  You have numbness or tingling in an arm or leg.  You have shortness of breath while active or at rest.  You have chest pain.  You have a rapid or irregular heartbeat.  You feel light-headed or dizzy.  You cough up blood.  You notice blood in your vomit, bowel movement, or urine. These symptoms may represent a serious problem that is an emergency. Do not wait to see if the symptoms will go away. Get medical help right away. Call your local emergency services (911 in the [place] Do not drive yourself to the hospital.  This information is not intended to replace advice given to you by your health care provider. Make sure you discuss any questions you have with your health care provider. Document Released: 12/31/2008 Document Revised: 08/17/2015 Document Reviewed: 06/30/2014 Elsevier Interactive Patient Education  2017 [employer].

## 2016-04-15 DIAGNOSIS — I82439 Acute embolism and thrombosis of unspecified popliteal vein: Secondary | ICD-10-CM | POA: Insufficient documentation

## 2016-04-15 DIAGNOSIS — Z86718 Personal history of other venous thrombosis and embolism: Secondary | ICD-10-CM | POA: Insufficient documentation

## 2016-04-17 ENCOUNTER — Ambulatory Visit: Payer: 59 | Admitting: Physical Therapy

## 2016-05-01 ENCOUNTER — Ambulatory Visit: Payer: 59 | Attending: Family Medicine | Admitting: Physical Therapy

## 2016-05-01 DIAGNOSIS — M545 Low back pain, unspecified: Secondary | ICD-10-CM
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
## 2016-05-03 ENCOUNTER — Ambulatory Visit: Payer: 59 | Admitting: Physical Therapy

## 2016-05-03 DIAGNOSIS — M545 Low back pain, unspecified: Secondary | ICD-10-CM

## 2016-05-03 NOTE — Therapy (Signed)
[place] [facility]IC[facility] [address] [place], [address] Phone: [phone]   Fax:  [phone]  Physical Therapy Treatment  Patient Details  Name: Amber Nelson MRN: [id number] Date of Birth: 10/20/1965 Referring Provider: Nilda Simmer  Encounter Date: 05/03/2016      PT End of Session - 05/03/16 1303    Visit Number 2   Number of Visits 7   Date for PT Re-Evaluation 06/12/16   PT Start Time 1252   PT Stop Time 1317   PT Time Calculation (min) 25 min   Activity Tolerance Patient tolerated treatment well   Behavior During Therapy [facility] for tasks assessed/performed      Past Medical History:  Diagnosis Date  . Allergy   . Anxiety   . Hypertension     Past Surgical History:  Procedure Laterality Date  . ABDOMINAL HYSTERECTOMY  05/13/14   ovaries intact; DUB, dysmenorrhea.  [place].  . CESAREAN SECTION      There were no vitals filed for this visit.      Subjective Assessment - 05/03/16 1302    Subjective Patient reports she has less pain and stiffness overall, some mild pain in lower side of R back and pain/stiffness in thoracic (upper) spine.    Limitations Sitting;Lifting;House hold activities   Patient Stated Goals To be able to return to exercise class and have less pain.    Currently in Pain? Yes  Stiffness in thoracic spine/R sided lower back pain as mild.      R rotation, R SLR, R side bend all assessed- mild pain/stiffness reported compared to baseline   Performed grade III mobilizations around T3-T4 x 5 minutes (reported no pain afterwards in thoracic region)   Performed grade III mobilizations to R PSIS - 2 bouts x 8 minutes (total in each) patient reported relief of pain, very mild stiffness with R SLR, R rotation   No pain or Gower's sign with gross lumbar flexion                              PT Education - 05/03/16 1303    Education provided Yes   Education  Details Will work on exercise technique next session for return to exercise.    Person(s) Educated Patient   Methods Explanation;Demonstration   Comprehension Returned demonstration;Verbalized understanding             PT Long Term Goals - 05/01/16 1517      PT LONG TERM GOAL #1   Title Patient will report worst pain of less than 3/10 to demonstrate improved tolerance for ADLs.    Baseline 7/10   Time 6   Period Weeks   Status New     PT LONG TERM GOAL #2   Title Patient will report mODI score of less than 20% to demonstrate improved tolerance for ADLs.    Baseline 34%   Time 6   Period Weeks   Status New     PT LONG TERM GOAL #3   Title Patient will return to exercise classes with no increased pain to complete recreational activities.    Time 6   Period Weeks   Status New               Plan - 05/03/16 1304    Clinical Impression Statement Pt reports mild pain at the PSIS and reports improvement from last session. Central PA  oscillations at grade II-III in T5-6 level and unilateral PA at L4-5 level grade II-III improved her stiffness and pain with thoracolumbar rotation, lumbar flexion, and lumbar lateral flexion. This will translate into improved lifting and exercising during the day.     Rehab Potential Good   Clinical Impairments Affecting Rehab Potential Positive response in first session.    PT Frequency 1x / week   PT Duration 6 weeks   PT Treatment/Interventions Aquatic Therapy;Moist Heat;Cryotherapy;Microbiologistlectrical Stimulation;Balance training;Therapeutic exercise;Therapeutic activities;Stair training;Gait training;Neuromuscular re-education;Manual techniques;Dry needling   PT Next Visit Plan Manual therapy assessment, progress stretching/strengthening.    PT Home Exercise Plan Lower trunk rotations, piriformis stretch    Consulted and Agree with Plan of Care Patient      Patient will benefit from skilled therapeutic intervention in order to improve the  following deficits and impairments:  Abnormal gait, Pain, Decreased activity tolerance  Visit Diagnosis: Acute bilateral low back pain without sciatica     Problem List Patient Active Problem List   Diagnosis Date Noted  . Acute deep vein thrombosis (DVT) of popliteal vein (HCC) 04/15/2016  . Essential hypertension, benign 07/13/2014  . Allergic rhinitis due to pollen 07/13/2014  . Allergic conjunctivitis 07/13/2014  . Obesity (BMI 30.0-34.9) 07/13/2014   Alva GarnetPatrick McNamara PT, DPT, CSCS    05/03/2016, 1:34 PM  [place] [facility] [address] [place], [place], [address] Phone: [phone]   Fax:  [phone]  Name: Amber Nelson MRN: [id number] Date of Birth: 07/03/1965

## 2016-05-08 ENCOUNTER — Ambulatory Visit: Payer: 59 | Admitting: Physical Therapy

## 2016-05-08 DIAGNOSIS — M545 Low back pain, unspecified: Secondary | ICD-10-CM

## 2016-05-08 NOTE — Therapy (Signed)
[facility] [address] [place], [address] Phone: [phone]   Fax:  [phone]  Physical Therapy Treatment  Patient Details  Name: Amber Nelson MRN: [id number] Date of Birth: [birth year] Referring Provider: Nilda Simmer  Encounter Date: 05/08/2016      PT End of Session - 05/08/16 1317    Visit Number 3   Number of Visits 7   Date for PT Re-Evaluation 06/12/16   PT Start Time 1255   PT Stop Time 1306   PT Time Calculation (min) 11 min   Activity Tolerance Patient tolerated treatment well   Behavior During Therapy [facility] for tasks assessed/performed      Past Medical History:  Diagnosis Date  . Allergy   . Anxiety   . Hypertension     Past Surgical History:  Procedure Laterality Date  . ABDOMINAL HYSTERECTOMY  05/13/14   ovaries intact; DUB, dysmenorrhea.  Harris.  . CESAREAN SECTION      There were no vitals filed for this visit.      Subjective Assessment - 05/08/16 1314    Subjective Patient reports she has been able to fully participate in her exercise programs. She reports her only complaints now are if she has prolonged sitting.    Limitations Sitting;Lifting;House hold activities   Patient Stated Goals To be able to return to exercise class and have less pain.    Currently in Pain? No/denies      Observed common exercises performed in her exercise program. Patient completed deadlift, hips and knees on the same plane, educated on set up and technique for more hip extensor dominant exercise.   Observed lunges, noted to have more hip/trunk flexion at bottom of exercise, cued for more upright trunk.   20# cleans from the floor with KB, no pain reported. Cued to bringing elbow underneath KB quicker.                            PT Education - 05/08/16 1316    Education provided Yes   Education Details Discuss with MD about jumping and kicking with DVT,  otherwise return to exercise.    Person(s) Educated Patient   Methods Explanation;Demonstration   Comprehension Verbalized understanding;Returned demonstration             PT Long Term Goals - 05/08/16 1319      PT LONG TERM GOAL #1   Title Patient will report worst pain of less than 3/10 to demonstrate improved tolerance for ADLs.    Baseline 7/10   Time 6   Period Weeks   Status Achieved     PT LONG TERM GOAL #2   Title Patient will report mODI score of less than 20% to demonstrate improved tolerance for ADLs.    Baseline 34%   Time 6   Period Weeks   Status On-going     PT LONG TERM GOAL #3   Title Patient will return to exercise classes with no increased pain to complete recreational activities.    Time 6   Period Weeks   Status Achieved               Plan - 05/08/16 1317    Clinical Impression Statement Patient reports she is able to fully participate in exercise program, she is now only having discomfort with prolonged sitting. Her exercise technique was improved on several exercises she is commonly performing.  She is no longer having her chief complaint and appears appropriate for discharge.    Rehab Potential Good   Clinical Impairments Affecting Rehab Potential Positive response in first session.    PT Frequency 1x / week   PT Duration 6 weeks   PT Treatment/Interventions Aquatic Therapy;Moist Heat;Cryotherapy;Microbiologistlectrical Stimulation;Balance training;Therapeutic exercise;Therapeutic activities;Stair training;Gait training;Neuromuscular re-education;Manual techniques;Dry needling   PT Next Visit Plan Manual therapy assessment, progress stretching/strengthening.    PT Home Exercise Plan Lower trunk rotations, piriformis stretch    Consulted and Agree with Plan of Care Patient      Patient will benefit from skilled therapeutic intervention in order to improve the following deficits and impairments:  Abnormal gait, Pain, Decreased activity  tolerance  Visit Diagnosis: Acute bilateral low back pain without sciatica     Problem List Patient Active Problem List   Diagnosis Date Noted  . Acute deep vein thrombosis (DVT) of popliteal vein (HCC) 04/15/2016  . Essential hypertension, benign 07/13/2014  . Allergic rhinitis due to pollen 07/13/2014  . Allergic conjunctivitis 07/13/2014  . Obesity (BMI 30.0-34.9) 07/13/2014   Alva GarnetPatrick McNamara PT, DPT, CSCS    05/08/2016, 1:20 PM  [facility] [address] [place], [place], [id number] Phone: [phone]   Fax:  [phone]  Name: Amber Nelson MRN: [id number] Date of Birth: 11/15/1965

## 2016-05-09 ENCOUNTER — Encounter: Payer: Self-pay | Admitting: Family Medicine

## 2016-05-11 ENCOUNTER — Encounter: Payer: 59 | Admitting: Physical Therapy

## 2016-05-30 ENCOUNTER — Other Ambulatory Visit: Payer: Self-pay | Admitting: Urgent Care

## 2016-05-30 ENCOUNTER — Other Ambulatory Visit: Payer: Self-pay | Admitting: Family Medicine

## 2016-07-10 ENCOUNTER — Ambulatory Visit (INDEPENDENT_AMBULATORY_CARE_PROVIDER_SITE_OTHER): Payer: 59 | Admitting: Family Medicine

## 2016-07-10 ENCOUNTER — Encounter: Payer: Self-pay | Admitting: Family Medicine

## 2016-07-10 VITALS — BP 159/100 | HR 65 | Temp 98.2°F | Resp 16 | Ht 63.5 in | Wt 200.6 lb

## 2016-07-10 DIAGNOSIS — E669 Obesity, unspecified: Secondary | ICD-10-CM | POA: Diagnosis not present

## 2016-07-10 DIAGNOSIS — I10 Essential (primary) hypertension: Secondary | ICD-10-CM

## 2016-07-10 DIAGNOSIS — J301 Allergic rhinitis due to pollen: Secondary | ICD-10-CM

## 2016-07-10 DIAGNOSIS — I82432 Acute embolism and thrombosis of left popliteal vein: Secondary | ICD-10-CM

## 2016-07-10 MED ORDER — LOSARTAN POTASSIUM 100 MG PO TABS: 100.0000 mg | ORAL_TABLET | Freq: Every day | ORAL | 3 refills | Status: DC
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
## 2016-07-10 NOTE — Patient Instructions (Addendum)
   IF you received an x-ray today, you will receive an invoice from [facility]. Please contact [facility] at [phone] with questions or concerns regarding your invoice.   IF you received labwork today, you will receive an invoice from [facility]. Please contact [facility] at [phone] with questions or concerns regarding your invoice.   Our billing staff will not be able to assist you with questions regarding bills from these companies.  You will be contacted with the lab results as soon as they are available. The fastest way to get your results is to activate your My Chart account. Instructions are located on the last page of this paperwork. If you have not heard from us regarding the results in 2 weeks, please contact this office.      Hypertension Hypertension, commonly called high blood pressure, is when the force of blood pumping through the arteries is too strong. The arteries are the blood vessels that carry blood from the heart throughout the body. Hypertension forces the heart to work harder to pump blood and may cause arteries to become narrow or stiff. Having untreated or uncontrolled hypertension can cause heart attacks, strokes, kidney disease, and other problems. A blood pressure reading consists of a higher number over a lower number. Ideally, your blood pressure should be below 120/80. The first ("top") number is called the systolic pressure. It is a measure of the pressure in your arteries as your heart beats. The second ("bottom") number is called the diastolic pressure. It is a measure of the pressure in your arteries as the heart relaxes. What are the causes? The cause of this condition is not known. What increases the risk? Some risk factors for high blood pressure are under your control. Others are not. Factors you can change   Smoking.  Having type 2 diabetes mellitus, high cholesterol, or both.  Not getting enough exercise or physical  activity.  Being overweight.  Having too much fat, sugar, calories, or salt (sodium) in your diet.  Drinking too much alcohol. Factors that are difficult or impossible to change   Having chronic kidney disease.  Having a family history of high blood pressure.  Age. Risk increases with age.  Race. You may be at higher risk if you are African-American.  Gender. Men are at higher risk than women before age 45. After age 65, women are at higher risk than men.  Having obstructive sleep apnea.  Stress. What are the signs or symptoms? Extremely high blood pressure (hypertensive crisis) may cause:  Headache.  Anxiety.  Shortness of breath.  Nosebleed.  Nausea and vomiting.  Severe chest pain.  Jerky movements you cannot control (seizures). How is this diagnosed? This condition is diagnosed by measuring your blood pressure while you are seated, with your arm resting on a surface. The cuff of the blood pressure monitor will be placed directly against the skin of your upper arm at the level of your heart. It should be measured at least twice using the same arm. Certain conditions can cause a difference in blood pressure between your right and left arms. Certain factors can cause blood pressure readings to be lower or higher than normal (elevated) for a short period of time:  When your blood pressure is higher when you are in a health care provider's office than when you are at home, this is called white coat hypertension. Most people with this condition do not need medicines.  When your blood pressure is higher at home than   when you are in a health care provider's office, this is called masked hypertension. Most people with this condition may need medicines to control blood pressure. If you have a high blood pressure reading during one visit or you have normal blood pressure with other risk factors:  You may be asked to return on a different day to have your blood pressure checked  again.  You may be asked to monitor your blood pressure at home for 1 week or longer. If you are diagnosed with hypertension, you may have other blood or imaging tests to help your health care provider understand your overall risk for other conditions. How is this treated? This condition is treated by making healthy lifestyle changes, such as eating healthy foods, exercising more, and reducing your alcohol intake. Your health care provider may prescribe medicine if lifestyle changes are not enough to get your blood pressure under control, and if:  Your systolic blood pressure is above 130.  Your diastolic blood pressure is above 80. Your personal target blood pressure may vary depending on your medical conditions, your age, and other factors. Follow these instructions at home: Eating and drinking   Eat a diet that is high in fiber and potassium, and low in sodium, added sugar, and fat. An example eating plan is called the DASH (Dietary Approaches to Stop Hypertension) diet. To eat this way:  Eat plenty of fresh fruits and vegetables. Try to fill half of your plate at each meal with fruits and vegetables.  Eat whole grains, such as whole wheat pasta, brown rice, or whole grain bread. Fill about one quarter of your plate with whole grains.  Eat or drink low-fat dairy products, such as skim milk or low-fat yogurt.  Avoid fatty cuts of meat, processed or cured meats, and poultry with skin. Fill about one quarter of your plate with lean proteins, such as fish, chicken without skin, beans, eggs, and tofu.  Avoid premade and processed foods. These tend to be higher in sodium, added sugar, and fat.  Reduce your daily sodium intake. Most people with hypertension should eat less than 1,500 mg of sodium a day.  Limit alcohol intake to no more than 1 drink a day for nonpregnant women and 2 drinks a day for men. One drink equals 12 oz of beer, 5 oz of wine, or 1 oz of hard liquor. Lifestyle   Work  with your health care provider to maintain a healthy body weight or to lose weight. Ask what an ideal weight is for you.  Get at least 30 minutes of exercise that causes your heart to beat faster (aerobic exercise) most days of the week. Activities may include walking, swimming, or biking.  Include exercise to strengthen your muscles (resistance exercise), such as pilates or lifting weights, as part of your weekly exercise routine. Try to do these types of exercises for 30 minutes at least 3 days a week.  Do not use any products that contain nicotine or tobacco, such as cigarettes and e-cigarettes. If you need help quitting, ask your health care provider.  Monitor your blood pressure at home as told by your health care provider.  Keep all follow-up visits as told by your health care provider. This is important. Medicines   Take over-the-counter and prescription medicines only as told by your health care provider. Follow directions carefully. Blood pressure medicines must be taken as prescribed.  Do not skip doses of blood pressure medicine. Doing this puts you at risk for   problems and can make the medicine less effective.  Ask your health care provider about side effects or reactions to medicines that you should watch for. Contact a health care provider if:  You think you are having a reaction to a medicine you are taking.  You have headaches that keep coming back (recurring).  You feel dizzy.  You have swelling in your ankles.  You have trouble with your vision. Get help right away if:  You develop a severe headache or confusion.  You have unusual weakness or numbness.  You feel faint.  You have severe pain in your chest or abdomen.  You vomit repeatedly.  You have trouble breathing. Summary  Hypertension is when the force of blood pumping through your arteries is too strong. If this condition is not controlled, it may put you at risk for serious complications.  Your  personal target blood pressure may vary depending on your medical conditions, your age, and other factors. For most people, a normal blood pressure is less than 120/80.  Hypertension is treated with lifestyle changes, medicines, or a combination of both. Lifestyle changes include weight loss, eating a healthy, low-sodium diet, exercising more, and limiting alcohol. This information is not intended to replace advice given to you by your health care provider. Make sure you discuss any questions you have with your health care provider. Document Released: 03/05/2005 Document Revised: 02/01/2016 Document Reviewed: 02/01/2016 Elsevier Interactive Patient Education  2017 Elsevier Inc.  

## 2016-07-11 LAB — CBC WITH DIFFERENTIAL/PLATELET
BASOS ABS: 0 10*3/uL (ref 0.0–0.2)
Basos: 0 %
EOS (ABSOLUTE): 0.1 10*3/uL (ref 0.0–0.4)
EOS: 1 %
HEMATOCRIT: 44.5 % (ref 34.0–46.6)
Hemoglobin: 15.2 g/dL (ref 11.1–15.9)
IMMATURE GRANS (ABS): 0 10*3/uL (ref 0.0–0.1)
IMMATURE GRANULOCYTES: 0 %
Lymphocytes Absolute: 2.1 10*3/uL (ref 0.7–3.1)
Lymphs: 28 %
MCH: 33 pg (ref 26.6–33.0)
MCHC: 34.2 g/dL (ref 31.5–35.7)
MCV: 97 fL (ref 79–97)
Monocytes Absolute: 0.5 10*3/uL (ref 0.1–0.9)
Monocytes: 7 %
NEUTROS PCT: 64 %
Neutrophils Absolute: 4.8 10*3/uL (ref 1.4–7.0)
PLATELETS: 267 10*3/uL (ref 150–379)
RBC: 4.61 x10E6/uL (ref 3.77–5.28)
RDW: 13.5 % (ref 12.3–15.4)
WBC: 7.6 10*3/uL (ref 3.4–10.8)

## 2016-07-11 LAB — COMPREHENSIVE METABOLIC PANEL
ALK PHOS: 68 IU/L (ref 39–117)
ALT: 15 IU/L (ref 0–32)
AST: 14 IU/L (ref 0–40)
Albumin/Globulin Ratio: 1.5 (ref 1.2–2.2)
Albumin: 4.3 g/dL (ref 3.5–5.5)
BUN/Creatinine Ratio: 14 (ref 9–23)
BUN: 13 mg/dL (ref 6–24)
Bilirubin Total: 0.2 mg/dL (ref 0.0–1.2)
CALCIUM: 9.5 mg/dL (ref 8.7–10.2)
CO2: 23 mmol/L (ref 18–29)
CREATININE: 0.93 mg/dL (ref 0.57–1.00)
Chloride: 100 mmol/L (ref 96–106)
GFR calc Af Amer: 83 mL/min/{1.73_m2} (ref >59)
GFR calc non Af Amer: 72 mL/min/{1.73_m2} (ref >59)
GLOBULIN, TOTAL: 2.8 g/dL (ref 1.5–4.5)
GLUCOSE: 96 mg/dL (ref 65–99)
Potassium: 4.8 mmol/L (ref 3.5–5.2)
Sodium: 139 mmol/L (ref 134–144)
Total Protein: 7.1 g/dL (ref 6.0–8.5)

## 2016-07-18 ENCOUNTER — Other Ambulatory Visit: Payer: Self-pay | Admitting: Physician Assistant

## 2016-07-20 ENCOUNTER — Ambulatory Visit (INDEPENDENT_AMBULATORY_CARE_PROVIDER_SITE_OTHER): Payer: 59 | Admitting: Obstetrics & Gynecology

## 2016-07-20 ENCOUNTER — Encounter: Payer: Self-pay | Admitting: Obstetrics & Gynecology

## 2016-07-20 VITALS — BP 140/82 | Ht 64.0 in | Wt 200.0 lb

## 2016-07-20 DIAGNOSIS — Z0000 Encounter for general adult medical examination without abnormal findings: Secondary | ICD-10-CM | POA: Diagnosis not present

## 2016-07-20 DIAGNOSIS — E669 Obesity, unspecified: Secondary | ICD-10-CM | POA: Diagnosis not present

## 2016-07-20 DIAGNOSIS — Z1231 Encounter for screening mammogram for malignant neoplasm of breast: Secondary | ICD-10-CM

## 2016-07-20 DIAGNOSIS — Z1239 Encounter for other screening for malignant neoplasm of breast: Secondary | ICD-10-CM

## 2016-07-20 NOTE — Patient Instructions (Signed)
[facility]
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
## 2016-08-06 ENCOUNTER — Encounter: Payer: Self-pay | Admitting: Family Medicine

## 2016-09-17 ENCOUNTER — Other Ambulatory Visit: Payer: Self-pay | Admitting: Family Medicine

## 2016-09-22 ENCOUNTER — Other Ambulatory Visit: Payer: Self-pay | Admitting: Emergency Medicine

## 2016-09-22 MED ORDER — RIVAROXABAN 20 MG PO TABS: 20.0000 mg | ORAL_TABLET | Freq: Every day | ORAL | 1 refills | Status: DC

## 2016-10-10 ENCOUNTER — Encounter: Payer: Self-pay | Admitting: Family Medicine

## 2016-10-10 ENCOUNTER — Ambulatory Visit (INDEPENDENT_AMBULATORY_CARE_PROVIDER_SITE_OTHER): Payer: 59 | Admitting: Family Medicine

## 2016-10-10 VITALS — BP 130/84 | HR 71 | Temp 97.6°F | Resp 18 | Ht 63.39 in | Wt 188.0 lb

## 2016-10-10 DIAGNOSIS — I82432 Acute embolism and thrombosis of left popliteal vein: Secondary | ICD-10-CM | POA: Diagnosis not present

## 2016-10-10 DIAGNOSIS — I10 Essential (primary) hypertension: Secondary | ICD-10-CM | POA: Diagnosis not present

## 2016-10-10 DIAGNOSIS — E669 Obesity, unspecified: Secondary | ICD-10-CM

## 2016-10-10 DIAGNOSIS — J301 Allergic rhinitis due to pollen: Secondary | ICD-10-CM | POA: Diagnosis not present

## 2016-10-10 MED ORDER — METOPROLOL SUCCINATE ER 50 MG PO TB24: ORAL_TABLET | ORAL | 1 refills | Status: DC

## 2016-10-10 MED ORDER — LOSARTAN POTASSIUM 100 MG PO TABS: 100.0000 mg | ORAL_TABLET | Freq: Every day | ORAL | 3 refills | Status: DC
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
## 2016-10-10 NOTE — Patient Instructions (Addendum)
   START ASPIRIN 81MG   ONE DAILY ONCE YOU STOP XARELTO.  IF you received an x-ray today, you will receive an invoice from [facility]. Please contact [facility] at [phone] with questions or concerns regarding your invoice.   IF you received labwork today, you will receive an invoice from [facility]. Please contact [facility] at [phone] with questions or concerns regarding your invoice.   Our billing staff will not be able to assist you with questions regarding bills from these companies.  You will be contacted with the lab results as soon as they are available. The fastest way to get your results is to activate your My Chart account. Instructions are located on the last page of this paperwork. If you have not heard from us regarding the results in 2 weeks, please contact this office.

## 2016-10-11 LAB — CBC WITH DIFFERENTIAL/PLATELET
BASOS ABS: 0 10*3/uL (ref 0.0–0.2)
Basos: 0 %
EOS (ABSOLUTE): 0.1 10*3/uL (ref 0.0–0.4)
Eos: 1 %
Hematocrit: 43.3 % (ref 34.0–46.6)
Hemoglobin: 14.4 g/dL (ref 11.1–15.9)
IMMATURE GRANS (ABS): 0 10*3/uL (ref 0.0–0.1)
Immature Granulocytes: 0 %
LYMPHS: 38 %
Lymphocytes Absolute: 2.1 10*3/uL (ref 0.7–3.1)
MCH: 33.5 pg — AB (ref 26.6–33.0)
MCHC: 33.3 g/dL (ref 31.5–35.7)
MCV: 101 fL — ABNORMAL HIGH (ref 79–97)
Monocytes Absolute: 0.5 10*3/uL (ref 0.1–0.9)
Monocytes: 10 %
NEUTROS ABS: 2.8 10*3/uL (ref 1.4–7.0)
Neutrophils: 51 %
PLATELETS: 253 10*3/uL (ref 150–379)
RBC: 4.3 x10E6/uL (ref 3.77–5.28)
RDW: 12.9 % (ref 12.3–15.4)
WBC: 5.6 10*3/uL (ref 3.4–10.8)

## 2016-10-11 LAB — COMPREHENSIVE METABOLIC PANEL
ALK PHOS: 58 IU/L (ref 39–117)
ALT: 14 IU/L (ref 0–32)
AST: 18 IU/L (ref 0–40)
Albumin/Globulin Ratio: 1.8 (ref 1.2–2.2)
Albumin: 4.6 g/dL (ref 3.5–5.5)
BILIRUBIN TOTAL: 0.4 mg/dL (ref 0.0–1.2)
BUN/Creatinine Ratio: 14 (ref 9–23)
BUN: 13 mg/dL (ref 6–24)
CHLORIDE: 103 mmol/L (ref 96–106)
CO2: 20 mmol/L (ref 20–29)
Calcium: 9.6 mg/dL (ref 8.7–10.2)
Creatinine, Ser: 0.91 mg/dL (ref 0.57–1.00)
GFR calc Af Amer: 85 mL/min/{1.73_m2} (ref >59)
GFR calc non Af Amer: 74 mL/min/{1.73_m2} (ref >59)
GLUCOSE: 87 mg/dL (ref 65–99)
Globulin, Total: 2.6 g/dL (ref 1.5–4.5)
Potassium: 4.9 mmol/L (ref 3.5–5.2)
Sodium: 140 mmol/L (ref 134–144)
TOTAL PROTEIN: 7.2 g/dL (ref 6.0–8.5)

## 2016-10-11 LAB — LIPID PANEL
CHOL/HDL RATIO: 4.6 ratio — AB (ref 0.0–4.4)
Cholesterol, Total: 161 mg/dL (ref 100–199)
HDL: 35 mg/dL — AB (ref >39)
LDL Calculated: 106 mg/dL — ABNORMAL HIGH (ref 0–99)
Triglycerides: 101 mg/dL (ref 0–149)
VLDL Cholesterol Cal: 20 mg/dL (ref 5–40)

## 2016-10-25 ENCOUNTER — Encounter: Payer: Self-pay | Admitting: Family Medicine

## 2016-11-18 ENCOUNTER — Encounter: Payer: Self-pay | Admitting: Family Medicine

## 2016-11-20 ENCOUNTER — Telehealth: Payer: Self-pay | Admitting: Family Medicine

## 2016-11-20 NOTE — Telephone Encounter (Signed)
PATIENT IS CALLING FOR ME TO ALSO PUT IN ANOTHER MESSAGE IN ADDITION TO THE 2 MY-CHART MESSAGES SHE HAS LEFT FOR DR. Katrinka BlazingSMITH REGARDING THE FORMS SHE HAS LEFT. SHE SAID IT IS URGENT. BEST PHONE [phone] (CELL) MBC

## 2016-11-22 NOTE — Telephone Encounter (Signed)
Spoke to [place] to inquire about forms.  She stated that forms should be ready by tomorrow.  I called patient and advised her of this.  She requested that the forms be mailed to her.  I told her that mailing could possibly take up to two weeks and she said that would be fine.

## 2016-11-26 ENCOUNTER — Encounter: Payer: Self-pay | Admitting: Family Medicine

## 2017-03-26 ENCOUNTER — Encounter: Payer: Self-pay | Admitting: Family Medicine

## 2017-04-04 ENCOUNTER — Other Ambulatory Visit: Payer: Self-pay | Admitting: Family Medicine

## 2017-04-22 ENCOUNTER — Other Ambulatory Visit: Payer: Self-pay

## 2017-04-22 ENCOUNTER — Encounter: Payer: Self-pay | Admitting: Family Medicine

## 2017-04-22 ENCOUNTER — Ambulatory Visit: Payer: Managed Care, Other (non HMO) | Admitting: Family Medicine

## 2017-04-22 VITALS — BP 122/72 | HR 71 | Temp 98.0°F | Resp 16 | Ht 64.57 in | Wt 196.0 lb

## 2017-04-22 DIAGNOSIS — Z86718 Personal history of other venous thrombosis and embolism: Secondary | ICD-10-CM | POA: Diagnosis not present

## 2017-04-22 DIAGNOSIS — I10 Essential (primary) hypertension: Secondary | ICD-10-CM

## 2017-04-22 DIAGNOSIS — J301 Allergic rhinitis due to pollen: Secondary | ICD-10-CM | POA: Diagnosis not present

## 2017-04-22 DIAGNOSIS — E6609 Other obesity due to excess calories: Secondary | ICD-10-CM

## 2017-04-22 DIAGNOSIS — E7800 Pure hypercholesterolemia, unspecified: Secondary | ICD-10-CM

## 2017-04-22 DIAGNOSIS — Z6833 Body mass index (BMI) 33.0-33.9, adult: Secondary | ICD-10-CM | POA: Diagnosis not present

## 2017-04-22 MED ORDER — METOPROLOL SUCCINATE ER 50 MG PO TB24: ORAL_TABLET | ORAL | 1 refills | Status: AC

## 2017-04-22 MED ORDER — SPIRONOLACTONE 50 MG PO TABS: 50.0000 mg | ORAL_TABLET | Freq: Every day | ORAL | 1 refills | Status: AC
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
## 2017-04-22 NOTE — Patient Instructions (Addendum)
Finish Losartan and start Spironolactone.  IF you received an x-ray today, you will receive an invoice from [facility]. Please contact [facility] at [phone] with questions or concerns regarding your invoice.   IF you received labwork today, you will receive an invoice from [facility]. Please contact [facility] at [phone] with questions or concerns regarding your invoice.   Our billing staff will not be able to assist you with questions regarding bills from these companies.  You will be contacted with the lab results as soon as they are available. The fastest way to get your results is to activate your My Chart account. Instructions are located on the last page of this paperwork. If you have not heard from us regarding the results in 2 weeks, please contact this office.      Managing Your Hypertension Hypertension is commonly called high blood pressure. This is when the force of your blood pressing against the walls of your arteries is too strong. Arteries are blood vessels that carry blood from your heart throughout your body. Hypertension forces the heart to work harder to pump blood, and may cause the arteries to become narrow or stiff. Having untreated or uncontrolled hypertension can cause heart attack, stroke, kidney disease, and other problems. What are blood pressure readings? A blood pressure reading consists of a higher number over a lower number. Ideally, your blood pressure should be below 120/80. The first ("top") number is called the systolic pressure. It is a measure of the pressure in your arteries as your heart beats. The second ("bottom") number is called the diastolic pressure. It is a measure of the pressure in your arteries as the heart relaxes. What does my blood pressure reading mean? Blood pressure is classified into four stages. Based on your blood pressure reading, your health care provider may use the following stages to determine what type of  treatment you need, if any. Systolic pressure and diastolic pressure are measured in a unit called mm Hg. Normal  Systolic pressure: below 120.  Diastolic pressure: below 80. Elevated  Systolic pressure: 120-129.  Diastolic pressure: below 80. Hypertension stage 1  Systolic pressure: 130-139.  Diastolic pressure: 80-89. Hypertension stage 2  Systolic pressure: 140 or above.  Diastolic pressure: 90 or above. What health risks are associated with hypertension? Managing your hypertension is an important responsibility. Uncontrolled hypertension can lead to:  A heart attack.  A stroke.  A weakened blood vessel (aneurysm).  Heart failure.  Kidney damage.  Eye damage.  Metabolic syndrome.  Memory and concentration problems.  What changes can I make to manage my hypertension? Hypertension can be managed by making lifestyle changes and possibly by taking medicines. Your health care provider will help you make a plan to bring your blood pressure within a normal range. Eating and drinking  Eat a diet that is high in fiber and potassium, and low in salt (sodium), added sugar, and fat. An example eating plan is called the DASH (Dietary Approaches to Stop Hypertension) diet. To eat this way: ? Eat plenty of fresh fruits and vegetables. Try to fill half of your plate at each meal with fruits and vegetables. ? Eat whole grains, such as whole wheat pasta, brown rice, or whole grain bread. Fill about one quarter of your plate with whole grains. ? Eat low-fat diary products. ? Avoid fatty cuts of meat, processed or cured meats, and poultry with skin. Fill about one quarter of your plate with lean proteins such as fish, chicken  without skin, beans, eggs, and tofu. ? Avoid premade and processed foods. These tend to be higher in sodium, added sugar, and fat.  Reduce your daily sodium intake. Most people with hypertension should eat less than 1,500 mg of sodium a day.  Limit alcohol  intake to no more than 1 drink a day for nonpregnant women and 2 drinks a day for men. One drink equals 12 oz of beer, 5 oz of wine, or 1 oz of hard liquor. Lifestyle  Work with your health care provider to maintain a healthy body weight, or to lose weight. Ask what an ideal weight is for you.  Get at least 30 minutes of exercise that causes your heart to beat faster (aerobic exercise) most days of the week. Activities may include walking, swimming, or biking.  Include exercise to strengthen your muscles (resistance exercise), such as weight lifting, as part of your weekly exercise routine. Try to do these types of exercises for 30 minutes at least 3 days a week.  Do not use any products that contain nicotine or tobacco, such as cigarettes and e-cigarettes. If you need help quitting, ask your health care provider.  Control any long-term (chronic) conditions you have, such as high cholesterol or diabetes. Monitoring  Monitor your blood pressure at home as told by your health care provider. Your personal target blood pressure may vary depending on your medical conditions, your age, and other factors.  Have your blood pressure checked regularly, as often as told by your health care provider. Working with your health care provider  Review all the medicines you take with your health care provider because there may be side effects or interactions.  Talk with your health care provider about your diet, exercise habits, and other lifestyle factors that may be contributing to hypertension.  Visit your health care provider regularly. Your health care provider can help you create and adjust your plan for managing hypertension. Will I need medicine to control my blood pressure? Your health care provider may prescribe medicine if lifestyle changes are not enough to get your blood pressure under control, and if:  Your systolic blood pressure is 130 or higher.  Your diastolic blood pressure is 80 or  higher.  Take medicines only as told by your health care provider. Follow the directions carefully. Blood pressure medicines must be taken as prescribed. The medicine does not work as well when you skip doses. Skipping doses also puts you at risk for problems. Contact a health care provider if:  You think you are having a reaction to medicines you have taken.  You have repeated (recurrent) headaches.  You feel dizzy.  You have swelling in your ankles.  You have trouble with your vision. Get help right away if:  You develop a severe headache or confusion.  You have unusual weakness or numbness, or you feel faint.  You have severe pain in your chest or abdomen.  You vomit repeatedly.  You have trouble breathing. Summary  Hypertension is when the force of blood pumping through your arteries is too strong. If this condition is not controlled, it may put you at risk for serious complications.  Your personal target blood pressure may vary depending on your medical conditions, your age, and other factors. For most people, a normal blood pressure is less than 120/80.  Hypertension is managed by lifestyle changes, medicines, or both. Lifestyle changes include weight loss, eating a healthy, low-sodium diet, exercising more, and limiting alcohol. This information is not  intended to replace advice given to you by your health care provider. Make sure you discuss any questions you have with your health care provider. Document Released: 11/28/2011 Document Revised: 02/01/2016 Document Reviewed: 02/01/2016 Elsevier Interactive Patient Education  [employer].

## 2017-04-23 ENCOUNTER — Ambulatory Visit: Payer: 59 | Admitting: Family Medicine

## 2017-04-23 LAB — LIPID PANEL
CHOLESTEROL TOTAL: 177 mg/dL (ref 100–199)
Chol/HDL Ratio: 4.4 ratio (ref 0.0–4.4)
HDL: 40 mg/dL (ref >39)
LDL Calculated: 113 mg/dL — ABNORMAL HIGH (ref 0–99)
TRIGLYCERIDES: 120 mg/dL (ref 0–149)
VLDL CHOLESTEROL CAL: 24 mg/dL (ref 5–40)

## 2017-04-23 LAB — COMPREHENSIVE METABOLIC PANEL
ALT: 16 IU/L (ref 0–32)
AST: 17 IU/L (ref 0–40)
Albumin/Globulin Ratio: 1.7 (ref 1.2–2.2)
Albumin: 4.7 g/dL (ref 3.5–5.5)
Alkaline Phosphatase: 70 IU/L (ref 39–117)
BUN/Creatinine Ratio: 15 (ref 9–23)
BUN: 14 mg/dL (ref 6–24)
Bilirubin Total: 0.4 mg/dL (ref 0.0–1.2)
CALCIUM: 9.7 mg/dL (ref 8.7–10.2)
CO2: 19 mmol/L — AB (ref 20–29)
CREATININE: 0.92 mg/dL (ref 0.57–1.00)
Chloride: 103 mmol/L (ref 96–106)
GFR calc Af Amer: 83 mL/min/{1.73_m2} (ref >59)
GFR, EST NON AFRICAN AMERICAN: 72 mL/min/{1.73_m2} (ref >59)
Globulin, Total: 2.7 g/dL (ref 1.5–4.5)
Glucose: 104 mg/dL — ABNORMAL HIGH (ref 65–99)
Potassium: 4.4 mmol/L (ref 3.5–5.2)
Sodium: 138 mmol/L (ref 134–144)
Total Protein: 7.4 g/dL (ref 6.0–8.5)

## 2017-06-03 ENCOUNTER — Other Ambulatory Visit: Payer: Self-pay | Admitting: Family Medicine

## 2017-06-03 DIAGNOSIS — I10 Essential (primary) hypertension: Secondary | ICD-10-CM

## 2017-06-04 NOTE — Telephone Encounter (Signed)
Losartan (Cozaar) refill Last OV: 04/22/17 Last Refill:not listed on med list  Pharmacy[facility] [place]  PCP: Nilda SimmerKristi Smith MD  Not on med list and OV states that: Controlled hypertension yet reluctant to continue losartan therapy.  Discontinue losartan and replaced with Spironolactone due to sulfa allergy

## 2017-07-09 ENCOUNTER — Other Ambulatory Visit: Payer: Self-pay | Admitting: Physician Assistant

## 2017-07-09 NOTE — Telephone Encounter (Signed)
Fluticasone refill Last OV: 04/22/17 Last Refill:05/21/16 32g 3 RF Pharmacy:[facility] [address] Nilda SimmerKristi Smith MD  Azelastine refill Last OV: 04/22/17 Last Refill:05/31/16 Pharmacy:[facility] Dr Katrinka BlazingSmith

## 2017-08-08 ENCOUNTER — Encounter: Payer: Self-pay | Admitting: Family Medicine

## 2017-08-13 ENCOUNTER — Encounter: Payer: Self-pay | Admitting: Family Medicine

## 2017-08-27 ENCOUNTER — Other Ambulatory Visit: Payer: Self-pay | Admitting: Family Medicine

## 2017-08-27 NOTE — Telephone Encounter (Signed)
Spironolactone refill Last Refill:04/22/17 # 90 tab 1 RF Last OV: 04/22/17 PCP: Nilda SimmerKristi Smith MD Pharmacy:[facility]  Metoprolol refill Last Refill:04/22/17 # 90 tab 1 RF Last OV: 04/22/17

## 2017-09-30 ENCOUNTER — Encounter: Payer: Self-pay | Admitting: Family Medicine

## 2017-10-21 ENCOUNTER — Ambulatory Visit: Payer: Managed Care, Other (non HMO) | Admitting: Family Medicine

## 2018-02-18 DIAGNOSIS — F411 Generalized anxiety disorder: Secondary | ICD-10-CM | POA: Insufficient documentation

## 2018-08-26 IMAGING — US US EXTREM LOW VENOUS*L*
1 series · 13 of 24 positions shown · non-contrast
Comparison: None.

CLINICAL DATA: 50-year-old female with left calf swelling.



[Series 1: us extrem low venous*left* · 0.08mm/px · 13 of 34 slices shown]
[im 1/34]
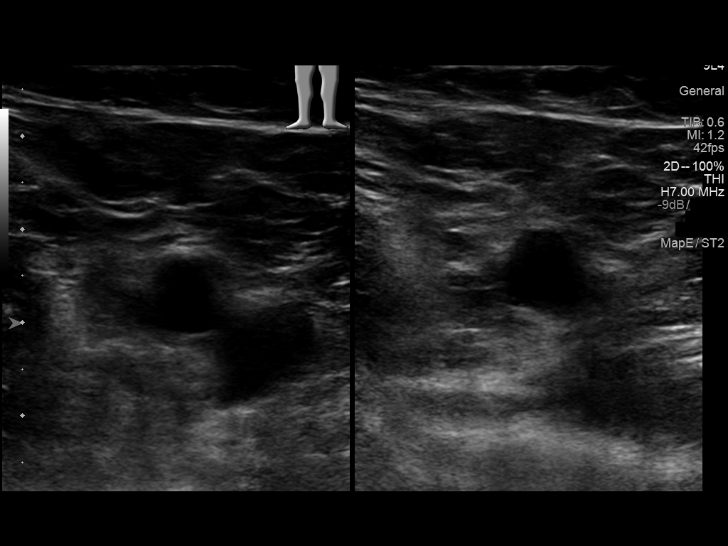
[im 3/34]
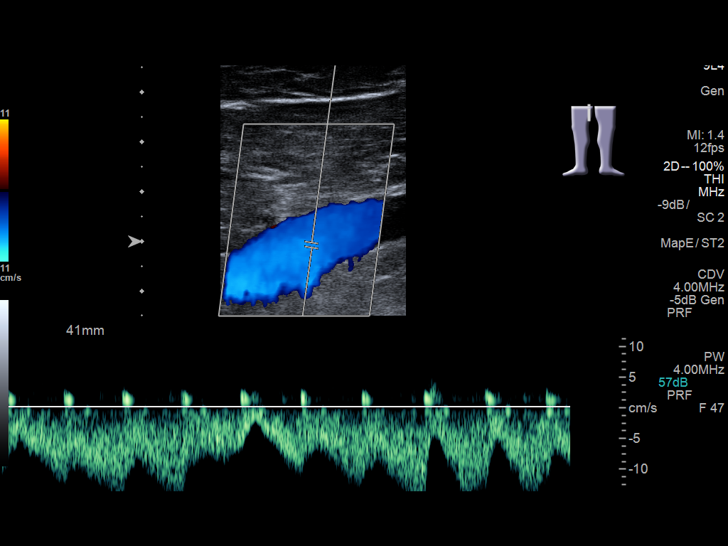
[im 6/34]
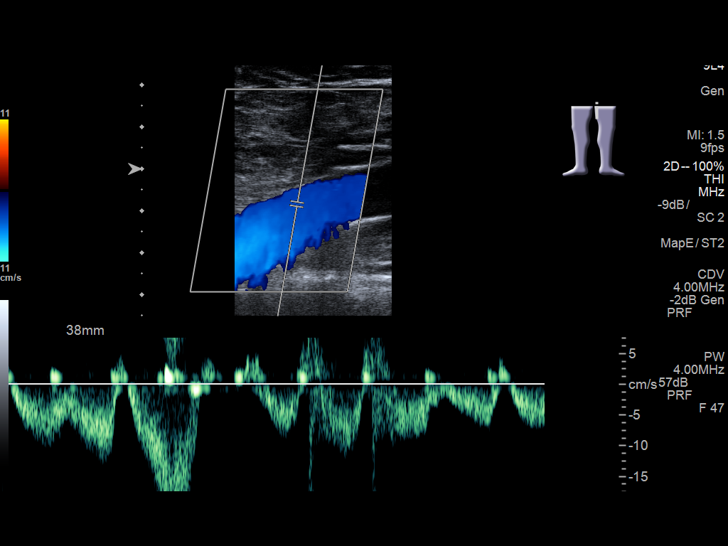
[im 9/34]
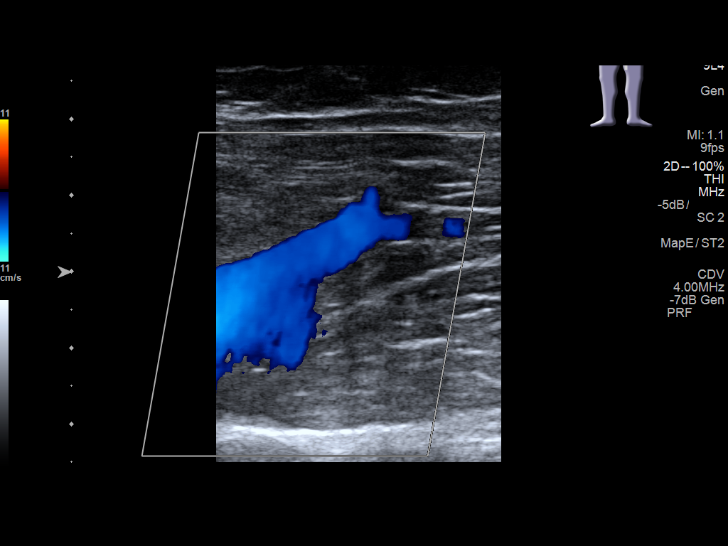
[im 12/34]
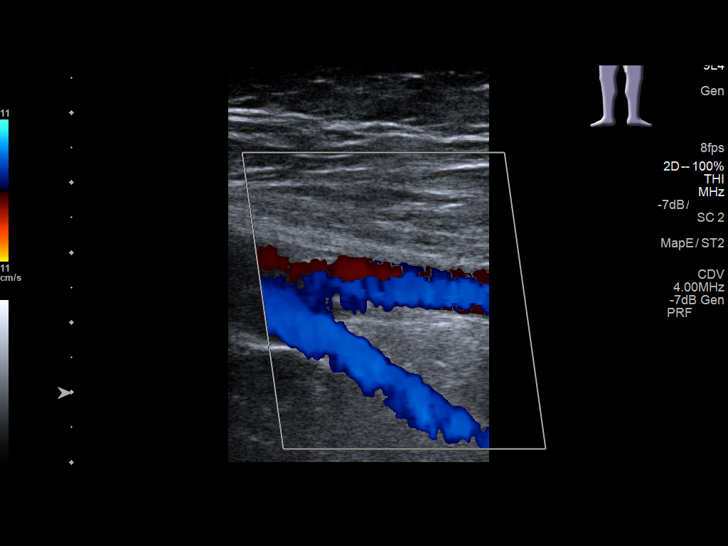
[im 15/34]
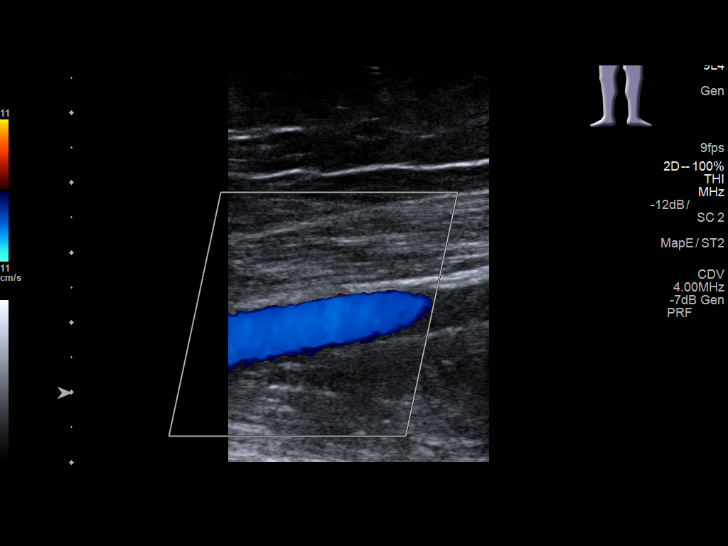
[im 18/34]
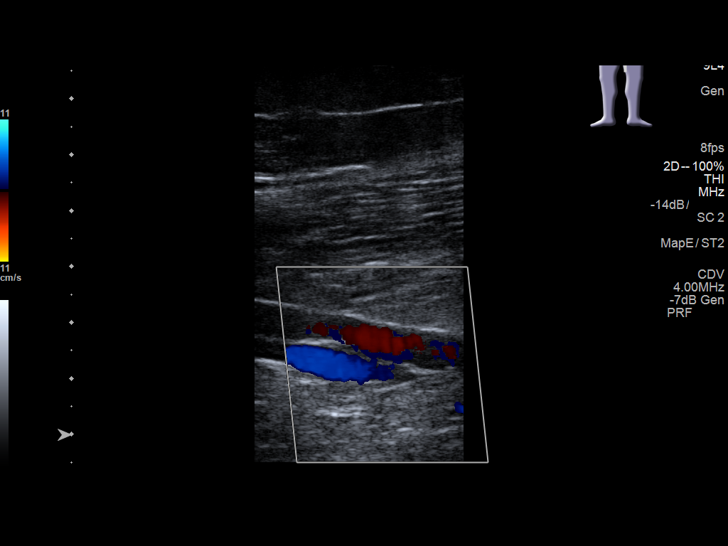
[im 19/34]
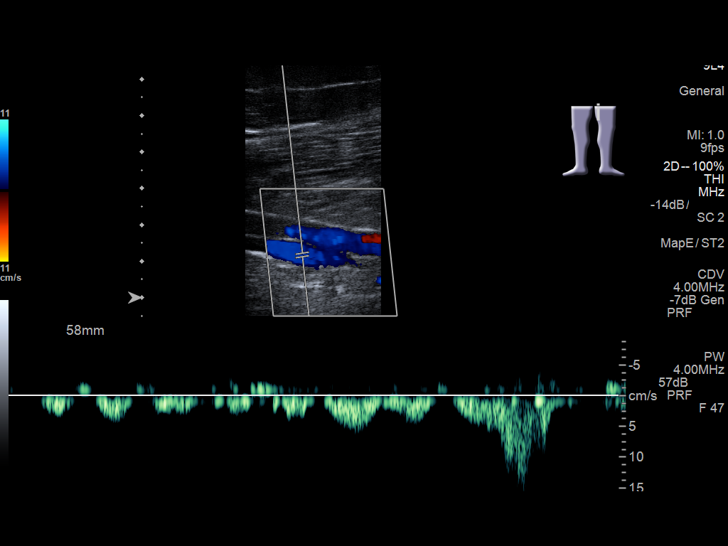
[im 22/34]
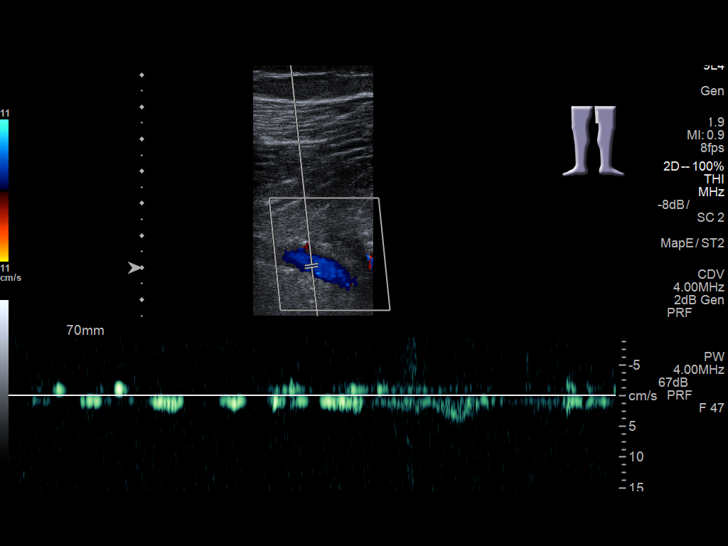
[im 25/34]
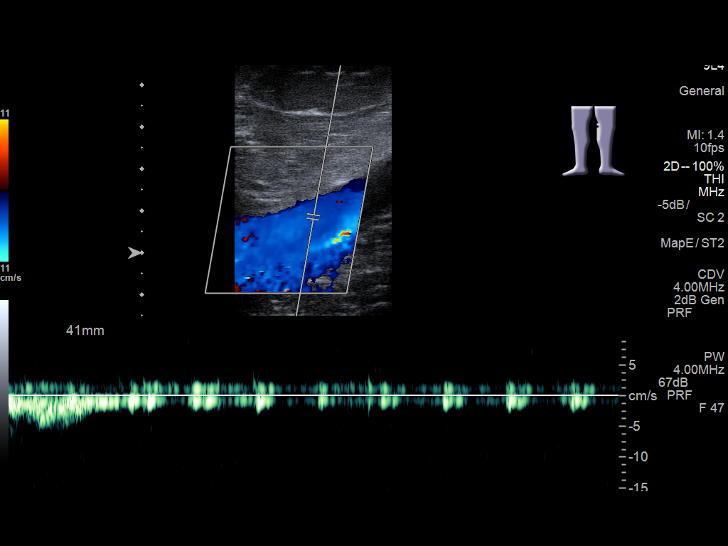
[im 28/34]
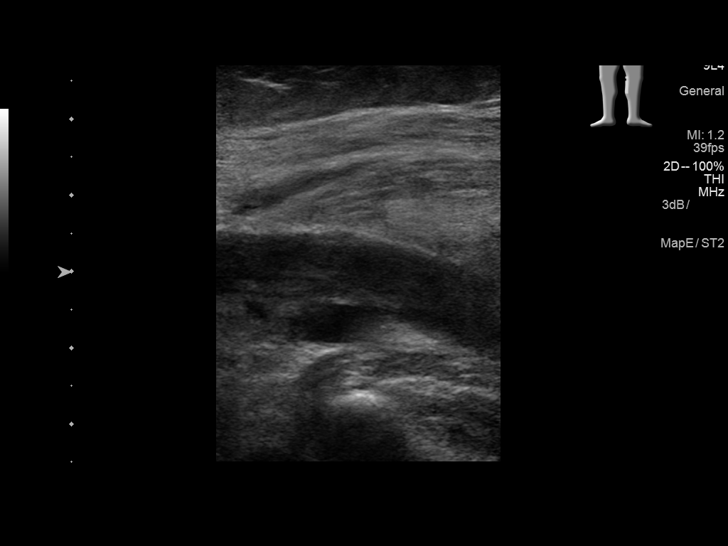
[im 31/34]
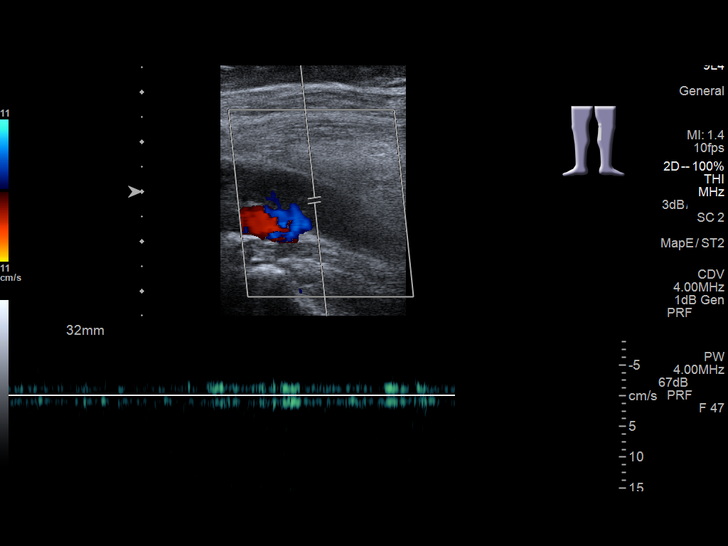
[im 34/34]
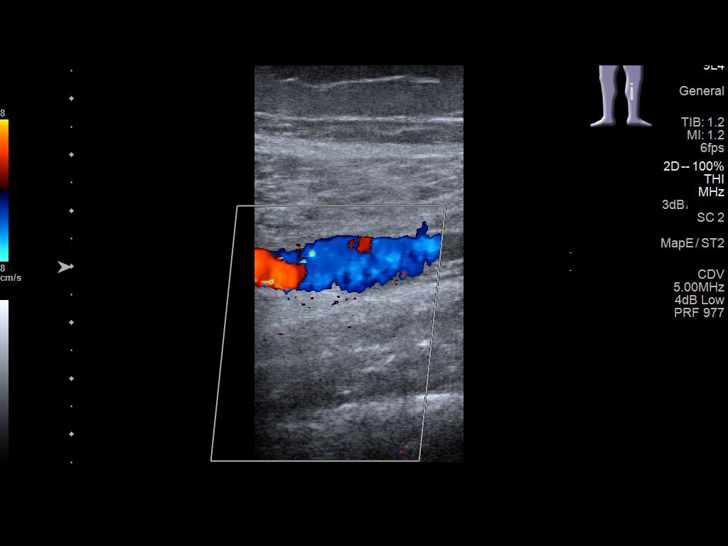

[13 of 24 positions shown; findings below may reference images not displayed]

FINDINGS: Contralateral Common Femoral Vein: Respiratory phasicity is normal
and symmetric with the symptomatic side. No evidence of thrombus.
Normal compressibility.

Common Femoral Vein: No evidence of thrombus. Normal
compressibility, respiratory phasicity and response to augmentation.

Saphenofemoral Junction: No evidence of thrombus. Normal
compressibility and flow on color Doppler imaging.

Profunda Femoral Vein: No evidence of thrombus. Normal
compressibility and flow on color Doppler imaging.

Femoral Vein: No evidence of thrombus. Normal compressibility,
respiratory phasicity and response to augmentation.

Popliteal Vein: The proximal portion of the popliteal vein appears
patent. There is noncompressibility of the distal portion of the
left popliteal vein. There is complete occlusion of this portion of
the vessel with echogenic thrombus.

Calf Veins: No evidence of thrombus. Normal compressibility and flow
on color Doppler imaging. The peroneal vein is not visualized.

Superficial Great Saphenous Vein: No evidence of thrombus. Normal
compressibility and flow on color Doppler imaging.

Venous Reflux:  None.

Other Findings:  None.
IMPRESSION: Occlusive DVT in the distal portion of the left popliteal vein.

These results were called by telephone at the time of interpretation
on 04/11/2016 at [DATE] to Dr. RINNAH TIGER , who verbally
acknowledged these results.

## 2019-01-29 ENCOUNTER — Other Ambulatory Visit: Payer: Self-pay | Admitting: Family Medicine

## 2019-01-29 DIAGNOSIS — Z1231 Encounter for screening mammogram for malignant neoplasm of breast: Secondary | ICD-10-CM

## 2020-01-06 ENCOUNTER — Encounter: Payer: Self-pay | Admitting: Oncology

## 2020-01-07 ENCOUNTER — Encounter: Payer: Self-pay | Admitting: Oncology

## 2020-01-07 ENCOUNTER — Inpatient Hospital Stay: Payer: Managed Care, Other (non HMO)

## 2020-01-07 ENCOUNTER — Inpatient Hospital Stay: Payer: Managed Care, Other (non HMO) | Attending: Oncology | Admitting: Oncology

## 2020-01-07 ENCOUNTER — Other Ambulatory Visit: Payer: Self-pay

## 2020-01-07 VITALS — BP 148/85 | HR 71 | Temp 98.4°F | Resp 20 | Wt 211.0 lb

## 2020-01-07 DIAGNOSIS — I10 Essential (primary) hypertension: Secondary | ICD-10-CM | POA: Insufficient documentation

## 2020-01-07 DIAGNOSIS — F419 Anxiety disorder, unspecified: Secondary | ICD-10-CM | POA: Diagnosis not present

## 2020-01-07 DIAGNOSIS — Z79899 Other long term (current) drug therapy: Secondary | ICD-10-CM | POA: Diagnosis not present

## 2020-01-07 DIAGNOSIS — Z7901 Long term (current) use of anticoagulants: Secondary | ICD-10-CM | POA: Insufficient documentation

## 2020-01-07 DIAGNOSIS — I82432 Acute embolism and thrombosis of left popliteal vein: Secondary | ICD-10-CM
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
## 2020-01-12 ENCOUNTER — Encounter: Payer: Self-pay | Admitting: Oncology

## 2020-01-14 ENCOUNTER — Encounter: Payer: Self-pay | Admitting: Oncology

## 2020-01-25 ENCOUNTER — Telehealth: Payer: Self-pay | Admitting: *Deleted

## 2020-01-25 NOTE — Telephone Encounter (Signed)
Call returned to patient and advised of doctor response, and we went over her results as requested. She stated that she hopes she will only have to be on anticoagulation therapy for 6 months

## 2020-01-25 NOTE — Telephone Encounter (Signed)
Stay on anticoagulation until I see her in January.  Thanks.

## 2020-01-25 NOTE — Telephone Encounter (Signed)
Patient called asking for results of labs drawn a few weeks ago. I see that they are scanned in to her chart under media tab Her next appointment is not until January

## 2020-02-01 ENCOUNTER — Encounter: Payer: Self-pay | Admitting: Oncology
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
## 2020-03-22 ENCOUNTER — Encounter: Payer: Self-pay | Admitting: Oncology

## 2020-03-22 ENCOUNTER — Inpatient Hospital Stay: Payer: Managed Care, Other (non HMO) | Attending: Oncology | Admitting: Oncology

## 2020-03-22 DIAGNOSIS — I82432 Acute embolism and thrombosis of left popliteal vein: Secondary | ICD-10-CM
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
## 2020-06-28 ENCOUNTER — Inpatient Hospital Stay: Payer: Managed Care, Other (non HMO) | Attending: Oncology | Admitting: Oncology

## 2020-06-28 ENCOUNTER — Encounter: Payer: Self-pay | Admitting: Oncology

## 2020-06-28 DIAGNOSIS — I82432 Acute embolism and thrombosis of left popliteal vein: Secondary | ICD-10-CM | POA: Diagnosis not present

## 2020-06-28 NOTE — Progress Notes (Signed)
Patient pre-screened for video visit. No concerns today.

## 2020-07-25 ENCOUNTER — Encounter: Payer: Self-pay | Admitting: Oncology

## 2022-06-28 ENCOUNTER — Other Ambulatory Visit: Payer: Self-pay | Admitting: Family Medicine

## 2022-06-28 DIAGNOSIS — Z1231 Encounter for screening mammogram for malignant neoplasm of breast: Secondary | ICD-10-CM

## 2023-12-10 ENCOUNTER — Other Ambulatory Visit: Payer: Self-pay | Admitting: Internal Medicine

## 2023-12-10 DIAGNOSIS — R0789 Other chest pain: Secondary | ICD-10-CM

## 2023-12-10 DIAGNOSIS — R079 Chest pain, unspecified: Secondary | ICD-10-CM

## 2023-12-10 DIAGNOSIS — I2089 Other forms of angina pectoris: Secondary | ICD-10-CM

## 2023-12-10 DIAGNOSIS — Z86718 Personal history of other venous thrombosis and embolism: Secondary | ICD-10-CM

## 2023-12-10 DIAGNOSIS — E66811 Obesity, class 1: Secondary | ICD-10-CM

## 2023-12-10 DIAGNOSIS — I10 Essential (primary) hypertension: Secondary | ICD-10-CM

## 2023-12-16 ENCOUNTER — Ambulatory Visit
Admission: RE | Admit: 2023-12-16 | Discharge: 2023-12-16 | Disposition: A | Discharge status: Home / Self Care | Payer: Self-pay | Source: Ambulatory Visit | Attending: Internal Medicine | Admitting: Internal Medicine

## 2023-12-16 DIAGNOSIS — R0789 Other chest pain: Secondary | ICD-10-CM | POA: Insufficient documentation

## 2023-12-16 DIAGNOSIS — I2089 Other forms of angina pectoris: Secondary | ICD-10-CM | POA: Insufficient documentation

## 2023-12-16 DIAGNOSIS — I10 Essential (primary) hypertension: Secondary | ICD-10-CM | POA: Insufficient documentation

## 2023-12-16 DIAGNOSIS — E66811 Obesity, class 1: Secondary | ICD-10-CM | POA: Insufficient documentation

## 2023-12-16 DIAGNOSIS — R079 Chest pain, unspecified: Secondary | ICD-10-CM | POA: Insufficient documentation

## 2023-12-16 DIAGNOSIS — Z86718 Personal history of other venous thrombosis and embolism: Secondary | ICD-10-CM | POA: Insufficient documentation
